# Patient Record
Sex: Female | Born: 1965
Health system: Southern US, Community
[De-identification: ages and names within clinical notes are randomized; demographics above are authoritative.]

## PROBLEM LIST (undated history)

## (undated) DIAGNOSIS — I480 Paroxysmal atrial fibrillation: Secondary | ICD-10-CM

## (undated) DIAGNOSIS — J690 Pneumonitis due to inhalation of food and vomit: Secondary | ICD-10-CM

## (undated) DIAGNOSIS — R6520 Severe sepsis without septic shock: Secondary | ICD-10-CM

## (undated) DIAGNOSIS — A419 Sepsis, unspecified organism: Secondary | ICD-10-CM

## (undated) DIAGNOSIS — J9621 Acute and chronic respiratory failure with hypoxia: Secondary | ICD-10-CM

## (undated) DIAGNOSIS — U071 COVID-19: Secondary | ICD-10-CM

## (undated) DIAGNOSIS — I469 Cardiac arrest, cause unspecified: Secondary | ICD-10-CM

---

## 2019-03-20 ENCOUNTER — Other Ambulatory Visit (HOSPITAL_COMMUNITY): Payer: Medicare Other

## 2019-03-20 ENCOUNTER — Encounter: Payer: Self-pay | Admitting: Internal Medicine

## 2019-03-20 ENCOUNTER — Inpatient Hospital Stay
Admission: RE | Admit: 2019-03-20 | Discharge: 2019-04-18 | Disposition: A | Discharge status: Home / Self Care | Payer: Medicare Other | Source: Ambulatory Visit | Attending: Internal Medicine | Admitting: Internal Medicine

## 2019-03-20 ENCOUNTER — Inpatient Hospital Stay: Admission: RE | Admit: 2019-03-20 | Payer: Self-pay | Source: Ambulatory Visit | Admitting: Internal Medicine

## 2019-03-20 DIAGNOSIS — J690 Pneumonitis due to inhalation of food and vomit: Secondary | ICD-10-CM | POA: Diagnosis not present

## 2019-03-20 DIAGNOSIS — U071 COVID-19: Secondary | ICD-10-CM | POA: Diagnosis present

## 2019-03-20 DIAGNOSIS — Z931 Gastrostomy status: Secondary | ICD-10-CM

## 2019-03-20 DIAGNOSIS — J9621 Acute and chronic respiratory failure with hypoxia: Secondary | ICD-10-CM | POA: Diagnosis not present

## 2019-03-20 DIAGNOSIS — I469 Cardiac arrest, cause unspecified: Secondary | ICD-10-CM | POA: Diagnosis present

## 2019-03-20 DIAGNOSIS — J9690 Respiratory failure, unspecified, unspecified whether with hypoxia or hypercapnia: Secondary | ICD-10-CM

## 2019-03-20 DIAGNOSIS — R6520 Severe sepsis without septic shock: Secondary | ICD-10-CM

## 2019-03-20 DIAGNOSIS — I480 Paroxysmal atrial fibrillation: Secondary | ICD-10-CM | POA: Diagnosis present

## 2019-03-20 DIAGNOSIS — A419 Sepsis, unspecified organism: Secondary | ICD-10-CM

## 2019-03-20 DIAGNOSIS — I639 Cerebral infarction, unspecified: Secondary | ICD-10-CM

## 2019-03-20 HISTORY — DX: Paroxysmal atrial fibrillation: I48.0

## 2019-03-20 HISTORY — DX: Acute and chronic respiratory failure with hypoxia: J96.21

## 2019-03-20 HISTORY — DX: COVID-19: U07.1

## 2019-03-20 HISTORY — DX: Pneumonitis due to inhalation of food and vomit: J69.0

## 2019-03-20 HISTORY — DX: Sepsis, unspecified organism: A41.9

## 2019-03-20 HISTORY — DX: Sepsis, unspecified organism: R65.20

## 2019-03-20 HISTORY — DX: Cardiac arrest, cause unspecified: I46.9

## 2019-03-20 LAB — CBC
HCT: 30.9 % — ABNORMAL LOW (ref 36.0–46.0)
Hemoglobin: 9.2 g/dL — ABNORMAL LOW (ref 12.0–15.0)
MCH: 28.6 pg (ref 26.0–34.0)
MCHC: 29.8 g/dL — ABNORMAL LOW (ref 30.0–36.0)
MCV: 96 fL (ref 80.0–100.0)
Platelets: 546 10*3/uL — ABNORMAL HIGH (ref 150–400)
RBC: 3.22 MIL/uL — ABNORMAL LOW (ref 3.87–5.11)
RDW: 14 % (ref 11.5–15.5)
WBC: 6.4 10*3/uL (ref 4.0–10.5)
nRBC: 0 % (ref 0.0–0.2)

## 2019-03-20 LAB — COMPREHENSIVE METABOLIC PANEL
ALT: 24 U/L (ref 0–44)
AST: 30 U/L (ref 15–41)
Albumin: 2.7 g/dL — ABNORMAL LOW (ref 3.5–5.0)
Alkaline Phosphatase: 115 U/L (ref 38–126)
Anion gap: 10 (ref 5–15)
BUN: 15 mg/dL (ref 6–20)
CO2: 36 mmol/L — ABNORMAL HIGH (ref 22–32)
Calcium: 10.9 mg/dL — ABNORMAL HIGH (ref 8.9–10.3)
Chloride: 97 mmol/L — ABNORMAL LOW (ref 98–111)
Creatinine, Ser: 0.49 mg/dL (ref 0.44–1.00)
GFR calc Af Amer: >60 mL/min (ref >60)
GFR calc non Af Amer: >60 mL/min (ref >60)
Glucose, Bld: 104 mg/dL — ABNORMAL HIGH (ref 70–99)
Potassium: 3.6 mmol/L (ref 3.5–5.1)
Sodium: 143 mmol/L (ref 135–145)
Total Bilirubin: 0.3 mg/dL (ref 0.3–1.2)
Total Protein: 7.8 g/dL (ref 6.5–8.1)

## 2019-03-20 LAB — URINALYSIS, ROUTINE W REFLEX MICROSCOPIC
Bilirubin Urine: NEGATIVE
Glucose, UA: NEGATIVE mg/dL
Hgb urine dipstick: NEGATIVE
Ketones, ur: NEGATIVE mg/dL
Leukocytes,Ua: NEGATIVE
Nitrite: NEGATIVE
Protein, ur: NEGATIVE mg/dL
Specific Gravity, Urine: 1.013 (ref 1.005–1.030)
pH: 6 (ref 5.0–8.0)

## 2019-03-20 LAB — BLOOD GAS, ARTERIAL
Acid-Base Excess: 10.1 mmol/L — ABNORMAL HIGH (ref 0.0–2.0)
Bicarbonate: 35.4 mmol/L — ABNORMAL HIGH (ref 20.0–28.0)
FIO2: 35
O2 Saturation: 95.6 %
Patient temperature: 37
pCO2 arterial: 60.3 mmHg — ABNORMAL HIGH (ref 32.0–48.0)
pH, Arterial: 7.387 (ref 7.350–7.450)
pO2, Arterial: 85 mmHg (ref 83.0–108.0)

## 2019-03-20 LAB — PROTIME-INR
INR: 1.2 (ref 0.8–1.2)
Prothrombin Time: 14.7 seconds (ref 11.4–15.2)

## 2019-03-20 LAB — TSH: TSH: 0.987 u[IU]/mL (ref 0.350–4.500)

## 2019-03-20 MED ORDER — IOHEXOL 300 MG/ML  SOLN
50.0000 mL | Freq: Once | INTRAMUSCULAR | Status: AC | PRN
  Administered 2019-03-20: 01:00:00 50 mL

## 2019-03-20 NOTE — Consult Note (Signed)
Pulmonary Critical Care Medicine [facility] [place]  PULMONARY SERVICE  Date of Service: 03/20/2019  PULMONARY CRITICAL CARE Shelley Hunter Shelley Hunter  ZOX:[id number]  DOB: March 21, 1965   DOA: 03/20/2019  Referring Physician: Carron Curie, MD  HPI: Shelley Hunter is a 54 y.o. female seen for follow up of Acute on Chronic Respiratory Failure.  Patient has multiple medical problems including stroke traumatic brain injury in 2013 chronic respiratory failure with chronic tracheostomy requiring nocturnal CPAP.  Patient also has had a history of a gastrostomy.  Baseline patient is nonverbal presented to the hospital because of respiratory distress apparently was found unresponsive by the family.  Noted to be in cardiac arrest CPR was started EMS called and patient was at that time noted to be hypotensive and hypoxic.  Patient was found to be in atrial fibrillation with rapid ventricular response in the ED started on fluid resuscitation and then placed on mechanical ventilation.  The patient was transferred to the ICU had ongoing issues with oxygenation.  Started on CPAP trials but patient was not really able to trigger.  The ventilator on her own.  Patient has been having issues with mucus plugging with excessive retained secretions.  Now transferred to our facility for further management  Review of Systems:  ROS performed and is unremarkable other than noted above.  Past medical history: Cardiac arrest Respiratory failure CPAP dependent Stroke Traumatic brain injury  Past surgical history tracheostomy PEG tube placement  Social history: Non-smoker nondrinker patient is pretty much bedbound  Family history: Noncontributory to the present illness   Medications: Reviewed on Rounds  Physical Exam:  Vitals: Temperature 98.1 pulse 90 respiratory rate 20 blood pressure is 108/64 saturations 100%  Ventilator Settings mode of ventilation assist control FiO2 30% tidal line 316 PEEP  5  . General: Comfortable at this time . Eyes: Grossly normal lids, irises & conjunctiva . ENT: grossly tongue is normal . Neck: no obvious mass . Cardiovascular: S1-S2 normal no gallop or rub . Respiratory: Coarse breath sounds few scattered rhonchi are noted bilaterally . Abdomen: Soft and nontender . Skin: no rash seen on limited exam . Musculoskeletal: not rigid . Psychiatric:unable to assess . Neurologic: no seizure no involuntary movements         Labs on Admission:  Basic Metabolic Panel: Recent Labs  Lab 03/20/19 0540  NA 143  K 3.6  CL 97*  CO2 36*  GLUCOSE 104*  BUN 15  CREATININE 0.49  CALCIUM 10.9*    Recent Labs  Lab 03/20/19 0206  PHART 7.387  PCO2ART 60.3*  PO2ART 85.0  HCO3 35.4*  O2SAT 95.6    Liver Function Tests: Recent Labs  Lab 03/20/19 0540  AST 30  ALT 24  ALKPHOS 115  BILITOT 0.3  PROT 7.8  ALBUMIN 2.7*   No results for input(s): LIPASE, AMYLASE in the last 168 hours. No results for input(s): AMMONIA in the last 168 hours.  CBC: Recent Labs  Lab 03/20/19 0540  WBC 6.4  HGB 9.2*  HCT 30.9*  MCV 96.0  PLT 546*    Cardiac Enzymes: No results for input(s): CKTOTAL, CKMB, CKMBINDEX, TROPONINI in the last 168 hours.  BNP (last 3 results) No results for input(s): BNP in the last 8760 hours.  ProBNP (last 3 results) No results for input(s): PROBNP in the last 8760 hours.   Radiological Exams on Admission: DG Chest Port 1 View  Result Date: 03/20/2019 CLINICAL DATA:  Respiratory failure EXAM: PORTABLE CHEST 1 VIEW  COMPARISON:  Abdominal radiograph 03/20/2019 FINDINGS: Tracheostomy tube terminates in the mid trachea. Linear metallic density projecting to the right of midline over the upper abdomen is indeterminate, correlate for a brace or device external to the patient. Telemetry leads overlie the chest. There is some mixed patchy and bandlike opacity in the right infrahilar lung which partially obscures the right  hemidiaphragm. Calcifications along the left heart border may reflect calcified hilar adenopathy versus atherosclerotic calcification of the aorta. Cardiomediastinal contours are otherwise unremarkable. Radiodense contrast material is projecting over the colon. Degenerative changes are present in the imaged spine and shoulders. IMPRESSION: 1. Right infrahilar opacity could reflect atelectasis, infection, edema or a combination there of. 2. Calcifications along the left mediastinal border may reflect calcified hilar adenopathy versus atherosclerotic calcification of the aorta. 3. Tracheostomy tube terminates in the mid trachea. 4. Linear metallic density projects to the right of the spine in the upper abdomen, correlate for finding external to the patient. Electronically Signed   By: Lovena Le M.D.   On: 03/20/2019 06:01   DG Abd Portable 1V  Result Date: 03/20/2019 CLINICAL DATA:  Peg tube placement EXAM: PORTABLE ABDOMEN - 1 VIEW COMPARISON:  None. FINDINGS: Gastrostomy tube projects over the midline and stomach. Contrast is seen within the stomach and proximal small bowel loops. No visible extravasation or [place] air. IMPRESSION: Gastrostomy tube within the stomach. No evidence of contrast extravasation. Electronically Signed   By: Rolm Baptise M.D.   On: 03/20/2019 01:17    Assessment/Plan Active Problems:   Acute on chronic respiratory failure with hypoxia (HCC)   Cardiac arrest (HCC)   COVID-19 virus infection   Severe sepsis (HCC)   Paroxysmal atrial fibrillation (HCC)   Aspiration pneumonia due to gastric secretions ([place])   1. Acute on chronic respiratory failure hypoxia patient currently is on the ventilator has not been tolerating attempts at weaning patient is going to be reassessed again and try to start her back on weaning so we can get her to T collar which is her baseline. 2. Cardiac arrest currently rhythm is stable we will continue to monitor on telemetry. 3. COVID-19 virus  infection resolved we will continue with supportive care 4. Severe sepsis right now hemodynamics are stable we will continue with present management 5. Paroxysmal atrial fibrillation rate controlled we will continue with supportive care 6. Pneumonia due to aspiration treated we will follow-up radiologically the last chest x-ray reveals atelectasis versus pneumonia.  I have personally seen and evaluated the patient, evaluated laboratory and imaging results, formulated the assessment and plan and placed orders. The Patient requires high complexity decision making with multiple systems involvement.  Case was discussed on Rounds with the Respiratory Therapy Director and the Respiratory staff Time Spent 3minutes  Conlin Brahm A Akil Hoos, MD [facility] Pulmonary Critical Care Medicine Sleep Medicine

## 2019-03-21 DIAGNOSIS — U071 COVID-19: Secondary | ICD-10-CM | POA: Diagnosis not present

## 2019-03-21 DIAGNOSIS — J9621 Acute and chronic respiratory failure with hypoxia: Secondary | ICD-10-CM | POA: Diagnosis not present

## 2019-03-21 DIAGNOSIS — I469 Cardiac arrest, cause unspecified: Secondary | ICD-10-CM | POA: Diagnosis not present

## 2019-03-21 DIAGNOSIS — J690 Pneumonitis due to inhalation of food and vomit: Secondary | ICD-10-CM | POA: Diagnosis not present

## 2019-03-21 NOTE — Progress Notes (Addendum)
Pulmonary Critical Care Medicine [place]   PULMONARY CRITICAL CARE SERVICE  PROGRESS NOTE  Date of Service: 03/21/2019  Tywana Robotham  [id number]  DOB: 04-25-1965   DOA: 03/20/2019  Referring Physician: Merton Border, MD  HPI: Shelley Hunter is a 54 y.o. female seen for follow up of Acute on Chronic Respiratory Failure.  Patient currently is on pressure support wean right now is requiring 30% FiO2.  Saturations are noted  Medications: Reviewed on Rounds  Physical Exam:  Vitals: Temperature 98.7 pulse 83 respiratory rate 18 blood pressure is 92/55 saturations 100%  Ventilator Settings mode pressure support FiO2 30% pressure 12 PEEP 5 tidal line 300  . General: Comfortable at this time . Eyes: Grossly normal lids, irises & conjunctiva . ENT: grossly tongue is normal . Neck: no obvious mass . Cardiovascular: S1 S2 normal no gallop . Respiratory: No rhonchi no rales . Abdomen: soft . Skin: no rash seen on limited exam . Musculoskeletal: not rigid . Psychiatric:unable to assess . Neurologic: no seizure no involuntary movements         Lab Data:   Basic Metabolic Panel: Recent Labs  Lab 03/20/19 0540  NA 143  K 3.6  CL 97*  CO2 36*  GLUCOSE 104*  BUN 15  CREATININE 0.49  CALCIUM 10.9*    ABG: Recent Labs  Lab 03/20/19 0206  PHART 7.387  PCO2ART 60.3*  PO2ART 85.0  HCO3 35.4*  O2SAT 95.6    Liver Function Tests: Recent Labs  Lab 03/20/19 0540  AST 30  ALT 24  ALKPHOS 115  BILITOT 0.3  PROT 7.8  ALBUMIN 2.7*   No results for input(s): LIPASE, AMYLASE in the last 168 hours. No results for input(s): AMMONIA in the last 168 hours.  CBC: Recent Labs  Lab 03/20/19 0540  WBC 6.4  HGB 9.2*  HCT 30.9*  MCV 96.0  PLT 546*    Cardiac Enzymes: No results for input(s): CKTOTAL, CKMB, CKMBINDEX, TROPONINI in the last 168 hours.  BNP (last 3 results) No results for input(s): BNP in the last 8760 hours.  ProBNP (last 3  results) No results for input(s): PROBNP in the last 8760 hours.  Radiological Exams: DG Chest Port 1 View  Result Date: 03/20/2019 CLINICAL DATA:  Respiratory failure EXAM: PORTABLE CHEST 1 VIEW COMPARISON:  Abdominal radiograph 03/20/2019 FINDINGS: Tracheostomy tube terminates in the mid trachea. Linear metallic density projecting to the right of midline over the upper abdomen is indeterminate, correlate for a brace or device external to the patient. Telemetry leads overlie the chest. There is some mixed patchy and bandlike opacity in the right infrahilar lung which partially obscures the right hemidiaphragm. Calcifications along the left heart border may reflect calcified hilar adenopathy versus atherosclerotic calcification of the aorta. Cardiomediastinal contours are otherwise unremarkable. Radiodense contrast material is projecting over the colon. Degenerative changes are present in the imaged spine and shoulders. IMPRESSION: 1. Right infrahilar opacity could reflect atelectasis, infection, edema or a combination there of. 2. Calcifications along the left mediastinal border may reflect calcified hilar adenopathy versus atherosclerotic calcification of the aorta. 3. Tracheostomy tube terminates in the mid trachea. 4. Linear metallic density projects to the right of the spine in the upper abdomen, correlate for finding external to the patient. Electronically Signed   By: Lovena Le M.D.   On: 03/20/2019 06:01   DG Abd Portable 1V  Result Date: 03/20/2019 CLINICAL DATA:  Peg tube placement EXAM: PORTABLE ABDOMEN - 1 VIEW COMPARISON:  None. FINDINGS: Gastrostomy tube projects over the midline and stomach. Contrast is seen within the stomach and proximal small bowel loops. No visible extravasation or [place] air. IMPRESSION: Gastrostomy tube within the stomach. No evidence of contrast extravasation. Electronically Signed   By: Charlett Nose M.D.   On: 03/20/2019 01:17    Assessment/Plan Active  Problems:   Acute on chronic respiratory failure with hypoxia (HCC)   Cardiac arrest (HCC)   COVID-19 virus infection   Severe sepsis (HCC)   Paroxysmal atrial fibrillation (HCC)   Aspiration pneumonia due to gastric secretions (HCC)   1. Acute on chronic respiratory failure hypoxia plan is to continue with pressure support mode titrate oxygen as tolerated continue pulmonary toilet supportive care. 2. Cardiac arrest rhythm stable 3. COVID-19 virus infection at baseline 4. Severe sepsis hemodynamics are stable resolved 5. Paroxysmal atrial fibrillation rate controlled 6. Aspiration pneumonia treated we will continue supportive care   I have personally seen and evaluated the patient, evaluated laboratory and imaging results, formulated the assessment and plan and placed orders. The Patient requires high complexity decision making with multiple systems involvement.  Rounds were done with the Respiratory Therapy Director and Staff therapists and discussed with nursing staff also.  Yevonne Pax, MD [facility] Pulmonary Critical Care Medicine Sleep Medicine

## 2019-03-22 DIAGNOSIS — J690 Pneumonitis due to inhalation of food and vomit: Secondary | ICD-10-CM | POA: Diagnosis not present

## 2019-03-22 DIAGNOSIS — U071 COVID-19: Secondary | ICD-10-CM | POA: Diagnosis not present

## 2019-03-22 DIAGNOSIS — J9621 Acute and chronic respiratory failure with hypoxia: Secondary | ICD-10-CM | POA: Diagnosis not present

## 2019-03-22 DIAGNOSIS — I469 Cardiac arrest, cause unspecified: Secondary | ICD-10-CM | POA: Diagnosis not present

## 2019-03-22 MED ORDER — FUROSEMIDE 40 MG PO TABS
40.00 | ORAL_TABLET | ORAL | Status: DC
Start: 2019-03-20 — End: 2019-03-22

## 2019-03-22 MED ORDER — ASPIRIN 81 MG PO TBEC
81.00 | DELAYED_RELEASE_TABLET | ORAL | Status: DC
Start: 2019-03-20 — End: 2019-03-22

## 2019-03-22 MED ORDER — CHOLECALCIFEROL 25 MCG (1000 UT) PO TABS
1000.00 | ORAL_TABLET | ORAL | Status: DC
Start: 2019-03-20 — End: 2019-03-22

## 2019-03-22 MED ORDER — QUETIAPINE FUMARATE 25 MG PO TABS
12.50 | ORAL_TABLET | ORAL | Status: DC
Start: ? — End: 2019-03-22

## 2019-03-22 MED ORDER — INSULIN LISPRO 100 UNIT/ML ~~LOC~~ SOLN
0.00 | SUBCUTANEOUS | Status: DC
Start: 2019-03-20 — End: 2019-03-22

## 2019-03-22 MED ORDER — ALBUTEROL SULFATE (5 MG/ML) 0.5% IN NEBU
2.50 | INHALATION_SOLUTION | RESPIRATORY_TRACT | Status: DC
Start: 2019-03-20 — End: 2019-03-22

## 2019-03-22 MED ORDER — SERTRALINE HCL 50 MG PO TABS
50.00 | ORAL_TABLET | ORAL | Status: DC
Start: 2019-03-20 — End: 2019-03-22

## 2019-03-22 MED ORDER — SENNOSIDES-DOCUSATE SODIUM 8.6-50 MG PO TABS
2.00 | ORAL_TABLET | ORAL | Status: DC
Start: 2019-03-20 — End: 2019-03-22

## 2019-03-22 MED ORDER — GLUCOSE-VITAMIN C 4-6 GM-MG PO CHEW
16.00 | CHEWABLE_TABLET | ORAL | Status: DC
Start: ? — End: 2019-03-22

## 2019-03-22 MED ORDER — APIXABAN 5 MG PO TABS
5.00 | ORAL_TABLET | ORAL | Status: DC
Start: 2019-03-20 — End: 2019-03-22

## 2019-03-22 MED ORDER — GENERIC EXTERNAL MEDICATION
Status: DC
Start: ? — End: 2019-03-22

## 2019-03-22 MED ORDER — DEXTROSE 50 % IV SOLN
25.00 | INTRAVENOUS | Status: DC
Start: ? — End: 2019-03-22

## 2019-03-22 MED ORDER — MIDODRINE HCL 5 MG PO TABS
5.00 | ORAL_TABLET | ORAL | Status: DC
Start: 2019-03-20 — End: 2019-03-22

## 2019-03-22 MED ORDER — L-LYSINE EX OINT
TOPICAL_OINTMENT | CUTANEOUS | Status: DC
Start: ? — End: 2019-03-22

## 2019-03-22 MED ORDER — MELATONIN 1 MG PO TABS
3.00 | ORAL_TABLET | ORAL | Status: DC
Start: 2019-03-20 — End: 2019-03-22

## 2019-03-22 MED ORDER — GLUCAGON (RDNA) 1 MG IJ KIT
1.00 | PACK | INTRAMUSCULAR | Status: DC
Start: ? — End: 2019-03-22

## 2019-03-22 MED ORDER — CHLORHEXIDINE GLUCONATE 0.12 % MT SOLN
15.00 | OROMUCOSAL | Status: DC
Start: 2019-03-20 — End: 2019-03-22

## 2019-03-22 NOTE — Progress Notes (Addendum)
Pulmonary Critical Care Medicine [facility] [place]   PULMONARY CRITICAL CARE SERVICE  PROGRESS NOTE  Date of Service: 03/22/2019  Shelley Hunter  WGN:[id number]  DOB: 1965/08/17   DOA: 03/20/2019  Referring Physician: Carron Curie, MD  Shelley Hunter is a 54 y.o. female seen for follow up of Acute on Chronic Respiratory Failure.  Patient completed 4 hours on pressure support is now back resting on full support ventilator.  No weakness or distress noted.  Medications: Reviewed on Rounds  Physical Exam:  Vitals: Pulse 72 respirations 18 BP 101/62 O2 sat 99% temp 97.6  Ventilator Settings ventilator mode AC VC rate of 15 tidal line 300 PEEP of 5 FiO2 30%  . General: Comfortable at this time . Eyes: Grossly normal lids, irises & conjunctiva . ENT: grossly tongue is normal . Neck: no obvious mass . Cardiovascular: S1 S2 normal no gallop . Respiratory: No rales or rhonchi noted . Abdomen: soft . Skin: no rash seen on limited exam . Musculoskeletal: not rigid . Psychiatric:unable to assess . Neurologic: no seizure no involuntary movements         Lab Data:   Basic Metabolic Panel: Recent Labs  Lab 03/20/19 0540  NA 143  K 3.6  CL 97*  CO2 36*  GLUCOSE 104*  BUN 15  CREATININE 0.49  CALCIUM 10.9*    ABG: Recent Labs  Lab 03/20/19 0206  PHART 7.387  PCO2ART 60.3*  PO2ART 85.0  HCO3 35.4*  O2SAT 95.6    Liver Function Tests: Recent Labs  Lab 03/20/19 0540  AST 30  ALT 24  ALKPHOS 115  BILITOT 0.3  PROT 7.8  ALBUMIN 2.7*   No results for input(s): LIPASE, AMYLASE in the last 168 hours. No results for input(s): AMMONIA in the last 168 hours.  CBC: Recent Labs  Lab 03/20/19 0540  WBC 6.4  HGB 9.2*  HCT 30.9*  MCV 96.0  PLT 546*    Cardiac Enzymes: No results for input(s): CKTOTAL, CKMB, CKMBINDEX, TROPONINI in the last 168 hours.  BNP (last 3 results) No results for input(s): BNP in the last 8760 hours.  ProBNP (last 3  results) No results for input(s): PROBNP in the last 8760 hours.  Radiological Exams: No results found.  Assessment/Plan Active Problems:   Acute on chronic respiratory failure with hypoxia (HCC)   Cardiac arrest (HCC)   COVID-19 virus infection   Severe sepsis (HCC)   Paroxysmal atrial fibrillation (HCC)   Aspiration pneumonia due to gastric secretions (HCC)   1. Acute on chronic respiratory failure hypoxia we will continue to attempt weaning per protocol.  Completed 4 hours today resting on the ventilator.  Continue supportive measures and pulmonary toilet 2. Cardiac arrest rhythm stable 3. COVID-19 virus infection at baseline 4. Severe sepsis hemodynamics are stable resolved 5. Paroxysmal atrial fibrillation rate controlled 6. Aspiration pneumonia treated we will continue supportive care   I have personally seen and evaluated the patient, evaluated laboratory and imaging results, formulated the assessment and plan and placed orders. The Patient requires high complexity decision making with multiple systems involvement.  Rounds were done with the Respiratory Therapy Director and Staff therapists and discussed with nursing staff also.  Yevonne Pax, MD [facility] Pulmonary Critical Care Medicine Sleep Medicine

## 2019-03-23 DIAGNOSIS — I469 Cardiac arrest, cause unspecified: Secondary | ICD-10-CM | POA: Diagnosis not present

## 2019-03-23 DIAGNOSIS — J690 Pneumonitis due to inhalation of food and vomit: Secondary | ICD-10-CM | POA: Diagnosis not present

## 2019-03-23 DIAGNOSIS — U071 COVID-19: Secondary | ICD-10-CM | POA: Diagnosis not present

## 2019-03-23 DIAGNOSIS — J9621 Acute and chronic respiratory failure with hypoxia: Secondary | ICD-10-CM | POA: Diagnosis not present

## 2019-03-23 NOTE — Progress Notes (Addendum)
Pulmonary Critical Care Medicine [facility] [place]   PULMONARY CRITICAL CARE SERVICE  PROGRESS NOTE  Date of Service: 03/23/2019  Anwyn Kriegel  [id number]  DOB: 08/15/1965   DOA: 03/20/2019  Referring Physician: Carron Curie, MD  HPI: Genessa Beman is a 54 y.o. female seen for follow up of Acute on Chronic Respiratory Failure.  Patient has an 8-hour goal today on pressure support 12/5 FiO2 30%.  Satting well at this time with no fever or distress.  Medications: Reviewed on Rounds  Physical Exam:  Vitals: Pulse 75 respirations 15 BP 103/61 O2 sat 99% temp 97.3  Ventilator Settings per support 12/5 FiO2 30%  . General: Comfortable at this time . Eyes: Grossly normal lids, irises & conjunctiva . ENT: grossly tongue is normal . Neck: no obvious mass . Cardiovascular: S1 S2 normal no gallop . Respiratory: No rales or rhonchi noted . Abdomen: soft . Skin: no rash seen on limited exam . Musculoskeletal: not rigid . Psychiatric:unable to assess . Neurologic: no seizure no involuntary movements         Lab Data:   Basic Metabolic Panel: Recent Labs  Lab 03/20/19 0540  NA 143  K 3.6  CL 97*  CO2 36*  GLUCOSE 104*  BUN 15  CREATININE 0.49  CALCIUM 10.9*    ABG: Recent Labs  Lab 03/20/19 0206  PHART 7.387  PCO2ART 60.3*  PO2ART 85.0  HCO3 35.4*  O2SAT 95.6    Liver Function Tests: Recent Labs  Lab 03/20/19 0540  AST 30  ALT 24  ALKPHOS 115  BILITOT 0.3  PROT 7.8  ALBUMIN 2.7*   No results for input(s): LIPASE, AMYLASE in the last 168 hours. No results for input(s): AMMONIA in the last 168 hours.  CBC: Recent Labs  Lab 03/20/19 0540  WBC 6.4  HGB 9.2*  HCT 30.9*  MCV 96.0  PLT 546*    Cardiac Enzymes: No results for input(s): CKTOTAL, CKMB, CKMBINDEX, TROPONINI in the last 168 hours.  BNP (last 3 results) No results for input(s): BNP in the last 8760 hours.  ProBNP (last 3 results) No results for input(s): PROBNP in the  last 8760 hours.  Radiological Exams: No results found.  Assessment/Plan Active Problems:   Acute on chronic respiratory failure with hypoxia (HCC)   Cardiac arrest (HCC)   COVID-19 virus infection   Severe sepsis (HCC)   Paroxysmal atrial fibrillation (HCC)   Aspiration pneumonia due to gastric secretions (HCC)   1. Acute on chronic respiratory failure hypoxia we will continue to attempt weaning per protocol.  Completed 8 hours today resting on the ventilator.  Continue supportive measures and pulmonary toilet 2. Cardiac arrest rhythm stable 3. COVID-19 virus infection at baseline 4. Severe sepsis hemodynamics are stable resolved 5. Paroxysmal atrial fibrillation rate controlled 6. Aspiration pneumonia treated we will continue supportive care   I have personally seen and evaluated the patient, evaluated laboratory and imaging results, formulated the assessment and plan and placed orders. The Patient requires high complexity decision making with multiple systems involvement.  Rounds were done with the Respiratory Therapy Director and Staff therapists and discussed with nursing staff also.  Yevonne Pax, MD [facility] Pulmonary Critical Care Medicine Sleep Medicine

## 2019-03-24 DIAGNOSIS — I469 Cardiac arrest, cause unspecified: Secondary | ICD-10-CM | POA: Diagnosis not present

## 2019-03-24 DIAGNOSIS — J690 Pneumonitis due to inhalation of food and vomit: Secondary | ICD-10-CM | POA: Diagnosis not present

## 2019-03-24 DIAGNOSIS — J9621 Acute and chronic respiratory failure with hypoxia: Secondary | ICD-10-CM | POA: Diagnosis not present

## 2019-03-24 DIAGNOSIS — U071 COVID-19: Secondary | ICD-10-CM | POA: Diagnosis not present

## 2019-03-24 MED ORDER — GENERIC EXTERNAL MEDICATION
Status: DC
Start: ? — End: 2019-03-24

## 2019-03-24 NOTE — Progress Notes (Signed)
Pulmonary Critical Care Medicine [facility] [place]   PULMONARY CRITICAL CARE SERVICE  PROGRESS NOTE  Date of Service: 03/24/2019  Shelley Hunter  [id number]  DOB: 08-31-1965   DOA: 03/20/2019  Referring Physician: Carron Curie, MD  HPI: Shelley Hunter is a 54 y.o. female seen for follow up of Acute on Chronic Respiratory Failure.  Patient is on pressure support now on 28% FiO2 12/5 with a goal of 8 hours  Medications: Reviewed on Rounds  Physical Exam:  Vitals: Temperature 98.3 pulse 85 respiratory rate 17 blood pressure is 113/65 saturations 100%  Ventilator Settings mode ventilation pressure support FiO2 20% pressure 12 PEEP 5  . General: Comfortable at this time . Eyes: Grossly normal lids, irises & conjunctiva . ENT: grossly tongue is normal . Neck: no obvious mass . Cardiovascular: S1 S2 normal no gallop . Respiratory: No rhonchi no rales are noted at this time . Abdomen: soft . Skin: no rash seen on limited exam . Musculoskeletal: not rigid . Psychiatric:unable to assess . Neurologic: no seizure no involuntary movements         Lab Data:   Basic Metabolic Panel: Recent Labs  Lab 03/20/19 0540  NA 143  K 3.6  CL 97*  CO2 36*  GLUCOSE 104*  BUN 15  CREATININE 0.49  CALCIUM 10.9*    ABG: Recent Labs  Lab 03/20/19 0206  PHART 7.387  PCO2ART 60.3*  PO2ART 85.0  HCO3 35.4*  O2SAT 95.6    Liver Function Tests: Recent Labs  Lab 03/20/19 0540  AST 30  ALT 24  ALKPHOS 115  BILITOT 0.3  PROT 7.8  ALBUMIN 2.7*   No results for input(s): LIPASE, AMYLASE in the last 168 hours. No results for input(s): AMMONIA in the last 168 hours.  CBC: Recent Labs  Lab 03/20/19 0540  WBC 6.4  HGB 9.2*  HCT 30.9*  MCV 96.0  PLT 546*    Cardiac Enzymes: No results for input(s): CKTOTAL, CKMB, CKMBINDEX, TROPONINI in the last 168 hours.  BNP (last 3 results) No results for input(s): BNP in the last 8760 hours.  ProBNP (last 3 results) No  results for input(s): PROBNP in the last 8760 hours.  Radiological Exams: No results found.  Assessment/Plan Active Problems:   Acute on chronic respiratory failure with hypoxia (HCC)   Cardiac arrest (HCC)   COVID-19 virus infection   Severe sepsis (HCC)   Paroxysmal atrial fibrillation (HCC)   Aspiration pneumonia due to gastric secretions (HCC)   1. Acute on chronic respiratory failure hypoxia plan is to continue with pressure support 5 goal 8 hours continue to advance as tolerated 2. Cardiac arrest rhythm stable we will continue to monitor 3. COVID-19 virus infection at baseline 4. Severe sepsis resolved 5. Paroxysmal atrial fibrillation rate controlled 6. Aspiration pneumonia treated we will continue with present management   I have personally seen and evaluated the patient, evaluated laboratory and imaging results, formulated the assessment and plan and placed orders. The Patient requires high complexity decision making with multiple systems involvement.  Rounds were done with the Respiratory Therapy Director and Staff therapists and discussed with nursing staff also.  Yevonne Pax, MD [facility] Pulmonary Critical Care Medicine Sleep Medicine

## 2019-03-25 DIAGNOSIS — J9621 Acute and chronic respiratory failure with hypoxia: Secondary | ICD-10-CM | POA: Diagnosis not present

## 2019-03-25 DIAGNOSIS — U071 COVID-19: Secondary | ICD-10-CM | POA: Diagnosis not present

## 2019-03-25 DIAGNOSIS — J690 Pneumonitis due to inhalation of food and vomit: Secondary | ICD-10-CM | POA: Diagnosis not present

## 2019-03-25 DIAGNOSIS — I469 Cardiac arrest, cause unspecified: Secondary | ICD-10-CM | POA: Diagnosis not present

## 2019-03-25 NOTE — Progress Notes (Signed)
Pulmonary Critical Care Medicine [facility] [place]   PULMONARY CRITICAL CARE SERVICE  PROGRESS NOTE  Date of Service: 03/25/2019  Shelley Hunter  [id number]  DOB: 1965/10/12   DOA: 03/20/2019  Referring Physician: Carron Curie, MD  HPI: Shelley Hunter is a 54 y.o. female seen for follow up of Acute on Chronic Respiratory Failure.  Patient right now is on pressure support with a goal of about 12 hours  Medications: Reviewed on Rounds  Physical Exam:  Vitals: Temperature 98.5 pulse 99 respiratory 15 blood pressure is 118/69 saturations 100%  Ventilator Settings mode ventilation pressure support FiO2 20% pressure 12 PEEP 5  . General: Comfortable at this time . Eyes: Grossly normal lids, irises & conjunctiva . ENT: grossly tongue is normal . Neck: no obvious mass . Cardiovascular: S1 S2 normal no gallop . Respiratory: No rhonchi coarse breath sounds are noted . Abdomen: soft . Skin: no rash seen on limited exam . Musculoskeletal: not rigid . Psychiatric:unable to assess . Neurologic: no seizure no involuntary movements         Lab Data:   Basic Metabolic Panel: Recent Labs  Lab 03/20/19 0540  NA 143  K 3.6  CL 97*  CO2 36*  GLUCOSE 104*  BUN 15  CREATININE 0.49  CALCIUM 10.9*    ABG: Recent Labs  Lab 03/20/19 0206  PHART 7.387  PCO2ART 60.3*  PO2ART 85.0  HCO3 35.4*  O2SAT 95.6    Liver Function Tests: Recent Labs  Lab 03/20/19 0540  AST 30  ALT 24  ALKPHOS 115  BILITOT 0.3  PROT 7.8  ALBUMIN 2.7*   No results for input(s): LIPASE, AMYLASE in the last 168 hours. No results for input(s): AMMONIA in the last 168 hours.  CBC: Recent Labs  Lab 03/20/19 0540  WBC 6.4  HGB 9.2*  HCT 30.9*  MCV 96.0  PLT 546*    Cardiac Enzymes: No results for input(s): CKTOTAL, CKMB, CKMBINDEX, TROPONINI in the last 168 hours.  BNP (last 3 results) No results for input(s): BNP in the last 8760 hours.  ProBNP (last 3 results) No results for  input(s): PROBNP in the last 8760 hours.  Radiological Exams: No results found.  Assessment/Plan Active Problems:   Acute on chronic respiratory failure with hypoxia (HCC)   Cardiac arrest (HCC)   COVID-19 virus infection   Severe sepsis (HCC)   Paroxysmal atrial fibrillation (HCC)   Aspiration pneumonia due to gastric secretions (HCC)   1. Acute on chronic respiratory failure with hypoxia plan is to continue with pressure support on 28% FiO2 with a goal of 12 hours 2. Cardiac arrest rhythm has been stable we will continue to monitor 3. COVID-19 virus infection treated we will continue with supportive care 4. Severe sepsis resolved 5. Paroxysmal atrial fibrillation rate controlled 6. Aspiration pneumonia treated clinically is improving   I have personally seen and evaluated the patient, evaluated laboratory and imaging results, formulated the assessment and plan and placed orders. The Patient requires high complexity decision making with multiple systems involvement.  Rounds were done with the Respiratory Therapy Director and Staff therapists and discussed with nursing staff also.  Yevonne Pax, MD [facility] Pulmonary Critical Care Medicine Sleep Medicine

## 2019-03-26 DIAGNOSIS — U071 COVID-19: Secondary | ICD-10-CM | POA: Diagnosis not present

## 2019-03-26 DIAGNOSIS — J690 Pneumonitis due to inhalation of food and vomit: Secondary | ICD-10-CM | POA: Diagnosis not present

## 2019-03-26 DIAGNOSIS — I469 Cardiac arrest, cause unspecified: Secondary | ICD-10-CM | POA: Diagnosis not present

## 2019-03-26 DIAGNOSIS — J9621 Acute and chronic respiratory failure with hypoxia: Secondary | ICD-10-CM | POA: Diagnosis not present

## 2019-03-26 LAB — BASIC METABOLIC PANEL
Anion gap: 12 (ref 5–15)
BUN: 20 mg/dL (ref 6–20)
CO2: 37 mmol/L — ABNORMAL HIGH (ref 22–32)
Calcium: 10.9 mg/dL — ABNORMAL HIGH (ref 8.9–10.3)
Chloride: 97 mmol/L — ABNORMAL LOW (ref 98–111)
Creatinine, Ser: 0.41 mg/dL — ABNORMAL LOW (ref 0.44–1.00)
GFR calc Af Amer: >60 mL/min (ref >60)
GFR calc non Af Amer: >60 mL/min (ref >60)
Glucose, Bld: 96 mg/dL (ref 70–99)
Potassium: 4.7 mmol/L (ref 3.5–5.1)
Sodium: 146 mmol/L — ABNORMAL HIGH (ref 135–145)

## 2019-03-26 LAB — CBC
HCT: 28.8 % — ABNORMAL LOW (ref 36.0–46.0)
Hemoglobin: 8.3 g/dL — ABNORMAL LOW (ref 12.0–15.0)
MCH: 28.2 pg (ref 26.0–34.0)
MCHC: 28.8 g/dL — ABNORMAL LOW (ref 30.0–36.0)
MCV: 98 fL (ref 80.0–100.0)
Platelets: 413 10*3/uL — ABNORMAL HIGH (ref 150–400)
RBC: 2.94 MIL/uL — ABNORMAL LOW (ref 3.87–5.11)
RDW: 14.6 % (ref 11.5–15.5)
WBC: 5.8 10*3/uL (ref 4.0–10.5)
nRBC: 0 % (ref 0.0–0.2)

## 2019-03-26 MED ORDER — GENERIC EXTERNAL MEDICATION
Status: DC
Start: ? — End: 2019-03-26

## 2019-03-26 NOTE — Progress Notes (Signed)
Pulmonary Critical Care Medicine [facility] [place]   PULMONARY CRITICAL CARE SERVICE  PROGRESS NOTE  Date of Service: 03/26/2019  Shelley Hunter  [id number]:[id number]  DOB: Apr 17, 1965   DOA: 03/20/2019  Referring Physician: Carron Curie, MD  HPI: Shelley Hunter is a 54 y.o. female seen for follow up of Acute on Chronic Respiratory Failure.  Patient is on pressure support 12/5 good tidal volumes weaning right now  Medications: Reviewed on Rounds  Physical Exam:  Vitals: Temperature 97.8 pulse 71 respiratory 15 blood pressure is 102/57 saturations 95%  Ventilator Settings on pressure support FiO2 28% pressure 12 PEEP 5  . General: Comfortable at this time . Eyes: Grossly normal lids, irises & conjunctiva . ENT: grossly tongue is normal . Neck: no obvious mass . Cardiovascular: S1 S2 normal no gallop . Respiratory: Coarse breath sounds few scattered rhonchi are noted . Abdomen: soft . Skin: no rash seen on limited exam . Musculoskeletal: not rigid . Psychiatric:unable to assess . Neurologic: no seizure no involuntary movements         Lab Data:   Basic Metabolic Panel: Recent Labs  Lab 03/20/19 0540 03/26/19 0643  NA 143 146*  K 3.6 4.7  CL 97* 97*  CO2 36* 37*  GLUCOSE 104* 96  BUN 15 20  CREATININE 0.49 0.41*  CALCIUM 10.9* 10.9*    ABG: Recent Labs  Lab 03/20/19 0206  PHART 7.387  PCO2ART 60.3*  PO2ART 85.0  HCO3 35.4*  O2SAT 95.6    Liver Function Tests: Recent Labs  Lab 03/20/19 0540  AST 30  ALT 24  ALKPHOS 115  BILITOT 0.3  PROT 7.8  ALBUMIN 2.7*   No results for input(s): LIPASE, AMYLASE in the last 168 hours. No results for input(s): AMMONIA in the last 168 hours.  CBC: Recent Labs  Lab 03/20/19 0540 03/26/19 0643  WBC 6.4 5.8  HGB 9.2* 8.3*  HCT 30.9* 28.8*  MCV 96.0 98.0  PLT 546* 413*    Cardiac Enzymes: No results for input(s): CKTOTAL, CKMB, CKMBINDEX, TROPONINI in the last 168 hours.  BNP (last 3 results) No  results for input(s): BNP in the last 8760 hours.  ProBNP (last 3 results) No results for input(s): PROBNP in the last 8760 hours.  Radiological Exams: No results found.  Assessment/Plan Active Problems:   Acute on chronic respiratory failure with hypoxia (HCC)   Cardiac arrest (HCC)   COVID-19 virus infection   Severe sepsis (HCC)   Paroxysmal atrial fibrillation (HCC)   Aspiration pneumonia due to gastric secretions (HCC)   1. Acute on chronic respiratory failure with hypoxia patient is currently on pressure support 12/5 plan is motivated continue with therapy 2. Cardiac arrest rhythm is stable we will continue to monitor 3. COVID-19 virus infection.  Resolving 4. Severe sepsis resolved 5. Paroxysmal atrial fibrillation rate controlled   I have personally seen and evaluated the patient, evaluated laboratory and imaging results, formulated the assessment and plan and placed orders. The Patient requires high complexity decision making with multiple systems involvement.  Rounds were done with the Respiratory Therapy Director and Staff therapists and discussed with nursing staff also.  Yevonne Pax, MD [facility] Pulmonary Critical Care Medicine Sleep Medicine

## 2019-03-27 DIAGNOSIS — J9621 Acute and chronic respiratory failure with hypoxia: Secondary | ICD-10-CM | POA: Diagnosis not present

## 2019-03-27 DIAGNOSIS — J690 Pneumonitis due to inhalation of food and vomit: Secondary | ICD-10-CM | POA: Diagnosis not present

## 2019-03-27 DIAGNOSIS — I469 Cardiac arrest, cause unspecified: Secondary | ICD-10-CM | POA: Diagnosis not present

## 2019-03-27 DIAGNOSIS — U071 COVID-19: Secondary | ICD-10-CM | POA: Diagnosis not present

## 2019-03-27 NOTE — Progress Notes (Signed)
Pulmonary Critical Care Medicine [facility] [place]   PULMONARY CRITICAL CARE SERVICE  PROGRESS NOTE  Date of Service: 03/27/2019  Tannya Gonet  EYC:[id number]  DOB: [birth year]   DOA: 03/20/2019  Referring Physician: Carron Curie, MD  HPI: Shelley Hunter is a 54 y.o. female seen for follow up of Acute on Chronic Respiratory Failure.  Patient is off the ventilator on the NAG currently is on 28% FiO2  Medications: Reviewed on Rounds  Physical Exam:  Vitals: Temperature 97.5 pulse 88 respiratory rate 20 blood pressure is 113/65  Ventilator Settings off the ventilator on the NAG FiO2 28%  . General: Comfortable at this time . Eyes: Grossly normal lids, irises & conjunctiva . ENT: grossly tongue is normal . Neck: no obvious mass . Cardiovascular: S1 S2 normal no gallop . Respiratory: No rhonchi no rales are noted at this time . Abdomen: soft . Skin: no rash seen on limited exam . Musculoskeletal: not rigid . Psychiatric:unable to assess . Neurologic: no seizure no involuntary movements         Lab Data:   Basic Metabolic Panel: Recent Labs  Lab 03/26/19 0643  NA 146*  K 4.7  CL 97*  CO2 37*  GLUCOSE 96  BUN 20  CREATININE 0.41*  CALCIUM 10.9*    ABG: No results for input(s): PHART, PCO2ART, PO2ART, HCO3, O2SAT in the last 168 hours.  Liver Function Tests: No results for input(s): AST, ALT, ALKPHOS, BILITOT, PROT, ALBUMIN in the last 168 hours. No results for input(s): LIPASE, AMYLASE in the last 168 hours. No results for input(s): AMMONIA in the last 168 hours.  CBC: Recent Labs  Lab 03/26/19 0643  WBC 5.8  HGB 8.3*  HCT 28.8*  MCV 98.0  PLT 413*    Cardiac Enzymes: No results for input(s): CKTOTAL, CKMB, CKMBINDEX, TROPONINI in the last 168 hours.  BNP (last 3 results) No results for input(s): BNP in the last 8760 hours.  ProBNP (last 3 results) No results for input(s): PROBNP in the last 8760 hours.  Radiological Exams: No results  found.  Assessment/Plan Active Problems:   Acute on chronic respiratory failure with hypoxia (HCC)   Cardiac arrest (HCC)   COVID-19 virus infection   Severe sepsis (HCC)   Paroxysmal atrial fibrillation (HCC)   Aspiration pneumonia due to gastric secretions (HCC)   1. Acute on chronic respiratory failure hypoxia plan continue with the NAG titrate oxygen as tolerated 2. COVID-19 virus infection treated improving 3. Severe sepsis resolved 4. Paroxysmal atrial fibrillation rate controlled 5. Cardiac arrest rhythm stable 6. Aspiration pneumonia treated we will continue to monitor   I have personally seen and evaluated the patient, evaluated laboratory and imaging results, formulated the assessment and plan and placed orders. The Patient requires high complexity decision making with multiple systems involvement.  Rounds were done with the Respiratory Therapy Director and Staff therapists and discussed with nursing staff also.  Yevonne Pax, MD [facility] Pulmonary Critical Care Medicine Sleep Medicine

## 2019-03-28 DIAGNOSIS — J690 Pneumonitis due to inhalation of food and vomit: Secondary | ICD-10-CM | POA: Diagnosis not present

## 2019-03-28 DIAGNOSIS — J9621 Acute and chronic respiratory failure with hypoxia: Secondary | ICD-10-CM | POA: Diagnosis not present

## 2019-03-28 DIAGNOSIS — I469 Cardiac arrest, cause unspecified: Secondary | ICD-10-CM | POA: Diagnosis not present

## 2019-03-28 DIAGNOSIS — U071 COVID-19: Secondary | ICD-10-CM | POA: Diagnosis not present

## 2019-03-28 MED ORDER — GENERIC EXTERNAL MEDICATION
Status: DC
Start: ? — End: 2019-03-28

## 2019-03-28 NOTE — Progress Notes (Signed)
Pulmonary Critical Care Medicine [facility] [place]   PULMONARY CRITICAL CARE SERVICE  PROGRESS NOTE  Date of Service: 03/28/2019  Shelley Hunter  [id number]  DOB: 1965/11/25   DOA: 03/20/2019  Referring Physician: Carron Curie, MD  HPI: Shelley Hunter is a 54 y.o. female seen for follow up of Acute on Chronic Respiratory Failure.  Patient right now is on full support on assist control mode respiratory therapy will assess weaning readiness today  Medications: Reviewed on Rounds  Physical Exam:  Vitals: Temperature 98.8 pulse 74 respiratory 18 blood pressure is 108/62 saturations 100%  Ventilator Settings on assist control FiO2 28% tidal volume 300 PEEP 5  . General: Comfortable at this time . Eyes: Grossly normal lids, irises & conjunctiva . ENT: grossly tongue is normal . Neck: no obvious mass . Cardiovascular: S1 S2 normal no gallop . Respiratory: Coarse breath sounds with few scattered rhonchi . Abdomen: soft . Skin: no rash seen on limited exam . Musculoskeletal: not rigid . Psychiatric:unable to assess . Neurologic: no seizure no involuntary movements         Lab Data:   Basic Metabolic Panel: Recent Labs  Lab 03/26/19 0643  NA 146*  K 4.7  CL 97*  CO2 37*  GLUCOSE 96  BUN 20  CREATININE 0.41*  CALCIUM 10.9*    ABG: No results for input(s): PHART, PCO2ART, PO2ART, HCO3, O2SAT in the last 168 hours.  Liver Function Tests: No results for input(s): AST, ALT, ALKPHOS, BILITOT, PROT, ALBUMIN in the last 168 hours. No results for input(s): LIPASE, AMYLASE in the last 168 hours. No results for input(s): AMMONIA in the last 168 hours.  CBC: Recent Labs  Lab 03/26/19 0643  WBC 5.8  HGB 8.3*  HCT 28.8*  MCV 98.0  PLT 413*    Cardiac Enzymes: No results for input(s): CKTOTAL, CKMB, CKMBINDEX, TROPONINI in the last 168 hours.  BNP (last 3 results) No results for input(s): BNP in the last 8760 hours.  ProBNP (last 3 results) No results for  input(s): PROBNP in the last 8760 hours.  Radiological Exams: No results found.  Assessment/Plan Active Problems:   Acute on chronic respiratory failure with hypoxia (HCC)   Cardiac arrest (HCC)   COVID-19 virus infection   Severe sepsis (HCC)   Paroxysmal atrial fibrillation (HCC)   Aspiration pneumonia due to gastric secretions (HCC)   1. Acute on chronic respiratory failure hypoxia plan is to continue with making attempts at weaning titrate oxygen continue pulmonary toilet 2. Cardiac arrest rhythm stable 3. COVID-19 virus infection no change 4. Severe sepsis hemodynamics are stable 5. Paroxysmal atrial fibrillation rate controlled 6. Aspiration pneumonia treated we will continue to follow   I have personally seen and evaluated the patient, evaluated laboratory and imaging results, formulated the assessment and plan and placed orders. The Patient requires high complexity decision making with multiple systems involvement.  Rounds were done with the Respiratory Therapy Director and Staff therapists and discussed with nursing staff also.  Yevonne Pax, MD [facility] Pulmonary Critical Care Medicine Sleep Medicine

## 2019-03-29 DIAGNOSIS — J9621 Acute and chronic respiratory failure with hypoxia: Secondary | ICD-10-CM | POA: Diagnosis not present

## 2019-03-29 DIAGNOSIS — U071 COVID-19: Secondary | ICD-10-CM | POA: Diagnosis not present

## 2019-03-29 DIAGNOSIS — J690 Pneumonitis due to inhalation of food and vomit: Secondary | ICD-10-CM | POA: Diagnosis not present

## 2019-03-29 DIAGNOSIS — I469 Cardiac arrest, cause unspecified: Secondary | ICD-10-CM | POA: Diagnosis not present

## 2019-03-29 NOTE — Progress Notes (Addendum)
Pulmonary Critical Care Medicine [facility] [place]   PULMONARY CRITICAL CARE SERVICE  PROGRESS NOTE  Date of Service: 03/29/2019  Shelley Hunter  [id number]  DOB: [birth year]   DOA: 03/20/2019  Referring Physician: Carron Curie, MD  HPI: Shelley Hunter is a 54 y.o. female seen for follow up of Acute on Chronic Respiratory Failure.  Patient remains on pressure support at this time for 16-hour goal today currently satting well no distress.  Medications: Reviewed on Rounds  Physical Exam:  Vitals: Pulse 79 respirations 19 BP 95/55 O2 sat 90% temp 97.6  Ventilator Settings pressure support 12/5 FiO2 28%  . General: Comfortable at this time . Eyes: Grossly normal lids, irises & conjunctiva . ENT: grossly tongue is normal . Neck: no obvious mass . Cardiovascular: S1 S2 normal no gallop . Respiratory: No rales or rhonchi noted . Abdomen: soft . Skin: no rash seen on limited exam . Musculoskeletal: not rigid . Psychiatric:unable to assess . Neurologic: no seizure no involuntary movements         Lab Data:   Basic Metabolic Panel: Recent Labs  Lab 03/26/19 0643  NA 146*  K 4.7  CL 97*  CO2 37*  GLUCOSE 96  BUN 20  CREATININE 0.41*  CALCIUM 10.9*    ABG: No results for input(s): PHART, PCO2ART, PO2ART, HCO3, O2SAT in the last 168 hours.  Liver Function Tests: No results for input(s): AST, ALT, ALKPHOS, BILITOT, PROT, ALBUMIN in the last 168 hours. No results for input(s): LIPASE, AMYLASE in the last 168 hours. No results for input(s): AMMONIA in the last 168 hours.  CBC: Recent Labs  Lab 03/26/19 0643  WBC 5.8  HGB 8.3*  HCT 28.8*  MCV 98.0  PLT 413*    Cardiac Enzymes: No results for input(s): CKTOTAL, CKMB, CKMBINDEX, TROPONINI in the last 168 hours.  BNP (last 3 results) No results for input(s): BNP in the last 8760 hours.  ProBNP (last 3 results) No results for input(s): PROBNP in the last 8760 hours.  Radiological Exams: No results  found.  Assessment/Plan Active Problems:   Acute on chronic respiratory failure with hypoxia (HCC)   Cardiac arrest (HCC)   COVID-19 virus infection   Severe sepsis (HCC)   Paroxysmal atrial fibrillation (HCC)   Aspiration pneumonia due to gastric secretions (HCC)   1. Acute on chronic respiratory failure hypoxia plan is for 16-hour goal today on pressure support 12/5 FiO2 28%.  Continue aggressive pulmonary toilet supportive measures. 2. Cardiac arrest rhythm stable 3. COVID-19 virus infection no change 4. Severe sepsis hemodynamics are stable 5. Paroxysmal atrial fibrillation rate controlled 6. Aspiration pneumonia treated we will continue to follow   I have personally seen and evaluated the patient, evaluated laboratory and imaging results, formulated the assessment and plan and placed orders. The Patient requires high complexity decision making with multiple systems involvement.  Rounds were done with the Respiratory Therapy Director and Staff therapists and discussed with nursing staff also.  Yevonne Pax, MD [facility] Pulmonary Critical Care Medicine Sleep Medicine

## 2019-03-30 DIAGNOSIS — U071 COVID-19: Secondary | ICD-10-CM | POA: Diagnosis not present

## 2019-03-30 DIAGNOSIS — J690 Pneumonitis due to inhalation of food and vomit: Secondary | ICD-10-CM | POA: Diagnosis not present

## 2019-03-30 DIAGNOSIS — I469 Cardiac arrest, cause unspecified: Secondary | ICD-10-CM | POA: Diagnosis not present

## 2019-03-30 DIAGNOSIS — J9621 Acute and chronic respiratory failure with hypoxia: Secondary | ICD-10-CM | POA: Diagnosis not present

## 2019-03-30 MED ORDER — GENERIC EXTERNAL MEDICATION
Status: DC
Start: ? — End: 2019-03-30

## 2019-03-30 NOTE — Progress Notes (Addendum)
Pulmonary Critical Care Medicine [facility] [place]   PULMONARY CRITICAL CARE SERVICE  PROGRESS NOTE  Date of Service: 03/30/2019  Clint Strupp  [id number]:[id number]  DOB: [birth year]   DOA: 03/20/2019  Referring Physician: Carron Curie, MD  HPI: Shelley Hunter is a 54 y.o. female seen for follow up of Acute on Chronic Respiratory Failure.  Patient failed weaning to 55 today remains on full support on the vent assist-control mode rate 15 with FiO2 28% satting well at this time.  Medications: Reviewed on Rounds  Physical Exam:  Vitals: Pulse 80 respirations 22 BP 123/60 O2 sat 100% temp 97.5  Ventilator Settings ventilator mode AC VC rate 15 tidal volume 300 PEEP of 5 and FiO2 28%  . General: Comfortable at this time . Eyes: Grossly normal lids, irises & conjunctiva . ENT: grossly tongue is normal . Neck: no obvious mass . Cardiovascular: S1 S2 normal no gallop . Respiratory: No rales or rhonchi noted . Abdomen: soft . Skin: no rash seen on limited exam . Musculoskeletal: not rigid . Psychiatric:unable to assess . Neurologic: no seizure no involuntary movements         Lab Data:   Basic Metabolic Panel: Recent Labs  Lab 03/26/19 0643  NA 146*  K 4.7  CL 97*  CO2 37*  GLUCOSE 96  BUN 20  CREATININE 0.41*  CALCIUM 10.9*    ABG: No results for input(s): PHART, PCO2ART, PO2ART, HCO3, O2SAT in the last 168 hours.  Liver Function Tests: No results for input(s): AST, ALT, ALKPHOS, BILITOT, PROT, ALBUMIN in the last 168 hours. No results for input(s): LIPASE, AMYLASE in the last 168 hours. No results for input(s): AMMONIA in the last 168 hours.  CBC: Recent Labs  Lab 03/26/19 0643  WBC 5.8  HGB 8.3*  HCT 28.8*  MCV 98.0  PLT 413*    Cardiac Enzymes: No results for input(s): CKTOTAL, CKMB, CKMBINDEX, TROPONINI in the last 168 hours.  BNP (last 3 results) No results for input(s): BNP in the last 8760 hours.  ProBNP (last 3 results) No results for  input(s): PROBNP in the last 8760 hours.  Radiological Exams: No results found.  Assessment/Plan Active Problems:   Acute on chronic respiratory failure with hypoxia (HCC)   Cardiac arrest (HCC)   COVID-19 virus infection   Severe sepsis (HCC)   Paroxysmal atrial fibrillation (HCC)   Aspiration pneumonia due to gastric secretions (HCC)   1. Acute on chronic respiratory failure hypoxia plan is to continue weaning however patient did fail NAG trial today.  Continue full support at this time rest at night and attempt weaning again tomorrow.  Continue supportive measures and pulmonary toilet. 2. Cardiac arrest rhythm stable 3. COVID-19 virus infection no change 4. Severe sepsis hemodynamics are stable 5. Paroxysmal atrial fibrillation rate controlled 6. Aspiration pneumonia treated we will continue to follow   I have personally seen and evaluated the patient, evaluated laboratory and imaging results, formulated the assessment and plan and placed orders. The Patient requires high complexity decision making with multiple systems involvement.  Rounds were done with the Respiratory Therapy Director and Staff therapists and discussed with nursing staff also.  Yevonne Pax, MD [facility] Pulmonary Critical Care Medicine Sleep Medicine

## 2019-03-31 DIAGNOSIS — U071 COVID-19: Secondary | ICD-10-CM | POA: Diagnosis not present

## 2019-03-31 DIAGNOSIS — I469 Cardiac arrest, cause unspecified: Secondary | ICD-10-CM | POA: Diagnosis not present

## 2019-03-31 DIAGNOSIS — J9621 Acute and chronic respiratory failure with hypoxia: Secondary | ICD-10-CM | POA: Diagnosis not present

## 2019-03-31 DIAGNOSIS — J690 Pneumonitis due to inhalation of food and vomit: Secondary | ICD-10-CM | POA: Diagnosis not present

## 2019-03-31 NOTE — Progress Notes (Signed)
Pulmonary Critical Care Medicine [facility] [place]   PULMONARY CRITICAL CARE SERVICE  PROGRESS NOTE  Date of Service: 03/31/2019  Shelley Hunter  [id number]  DOB: 05/05/1965   DOA: 03/20/2019  Referring Physician: Carron Curie, MD  HPI: Shelley Hunter is a 54 y.o. female seen for follow up of Acute on Chronic Respiratory Failure.  Patient is on the NAG currently is comfortable right now without distress tolerating it well  Medications: Reviewed on Rounds  Physical Exam:  Vitals: Temperature is 98.1 pulse 95 respiratory rate 21 blood pressure 96/58 saturations 94%  Ventilator Settings off the ventilator on the NAG  . General: Comfortable at this time . Eyes: Grossly normal lids, irises & conjunctiva . ENT: grossly tongue is normal . Neck: no obvious mass . Cardiovascular: S1 S2 normal no gallop . Respiratory: Scattered rhonchi expansion is equal . Abdomen: soft . Skin: no rash seen on limited exam . Musculoskeletal: not rigid . Psychiatric:unable to assess . Neurologic: no seizure no involuntary movements         Lab Data:   Basic Metabolic Panel: Recent Labs  Lab 03/26/19 0643  NA 146*  K 4.7  CL 97*  CO2 37*  GLUCOSE 96  BUN 20  CREATININE 0.41*  CALCIUM 10.9*    ABG: No results for input(s): PHART, PCO2ART, PO2ART, HCO3, O2SAT in the last 168 hours.  Liver Function Tests: No results for input(s): AST, ALT, ALKPHOS, BILITOT, PROT, ALBUMIN in the last 168 hours. No results for input(s): LIPASE, AMYLASE in the last 168 hours. No results for input(s): AMMONIA in the last 168 hours.  CBC: Recent Labs  Lab 03/26/19 0643  WBC 5.8  HGB 8.3*  HCT 28.8*  MCV 98.0  PLT 413*    Cardiac Enzymes: No results for input(s): CKTOTAL, CKMB, CKMBINDEX, TROPONINI in the last 168 hours.  BNP (last 3 results) No results for input(s): BNP in the last 8760 hours.  ProBNP (last 3 results) No results for input(s): PROBNP in the last 8760  hours.  Radiological Exams: No results found.  Assessment/Plan Active Problems:   Acute on chronic respiratory failure with hypoxia (HCC)   Cardiac arrest (HCC)   COVID-19 virus infection   Severe sepsis (HCC)   Paroxysmal atrial fibrillation (HCC)   Aspiration pneumonia due to gastric secretions (HCC)   1. Acute on chronic respiratory failure hypoxia plan is to continue on the NAG wean as tolerated 2. Cardiac arrest rhythm has been stable 3. COVID-19 virus infection now in resolution phase 4. Severe sepsis resolved 5. Paroxysmal atrial fibrillation rate controlled 6. Aspiration pneumonia treated we will continue to follow   I have personally seen and evaluated the patient, evaluated laboratory and imaging results, formulated the assessment and plan and placed orders. The Patient requires high complexity decision making with multiple systems involvement.  Rounds were done with the Respiratory Therapy Director and Staff therapists and discussed with nursing staff also.  Yevonne Pax, MD [facility] Pulmonary Critical Care Medicine Sleep Medicine

## 2019-04-01 ENCOUNTER — Other Ambulatory Visit (HOSPITAL_COMMUNITY): Payer: Medicare Other

## 2019-04-01 DIAGNOSIS — I469 Cardiac arrest, cause unspecified: Secondary | ICD-10-CM | POA: Diagnosis not present

## 2019-04-01 DIAGNOSIS — J9621 Acute and chronic respiratory failure with hypoxia: Secondary | ICD-10-CM | POA: Diagnosis not present

## 2019-04-01 DIAGNOSIS — U071 COVID-19: Secondary | ICD-10-CM | POA: Diagnosis not present

## 2019-04-01 DIAGNOSIS — J690 Pneumonitis due to inhalation of food and vomit: Secondary | ICD-10-CM | POA: Diagnosis not present

## 2019-04-01 LAB — TROPONIN I (HIGH SENSITIVITY)
Troponin I (High Sensitivity): 24 ng/L — ABNORMAL HIGH (ref <18)
Troponin I (High Sensitivity): 45 ng/L — ABNORMAL HIGH (ref <18)
Troponin I (High Sensitivity): 63 ng/L — ABNORMAL HIGH (ref <18)

## 2019-04-01 LAB — BLOOD GAS, ARTERIAL
Acid-Base Excess: 10.1 mmol/L — ABNORMAL HIGH (ref 0.0–2.0)
Acid-Base Excess: 9.7 mmol/L — ABNORMAL HIGH (ref 0.0–2.0)
Acid-Base Excess: 11.5 mmol/L — ABNORMAL HIGH (ref 0.0–2.0)
Bicarbonate: 41.5 mmol/L — ABNORMAL HIGH (ref 20.0–28.0)
Bicarbonate: 36.7 mmol/L — ABNORMAL HIGH (ref 20.0–28.0)
Bicarbonate: 36.4 mmol/L — ABNORMAL HIGH (ref 20.0–28.0)
FIO2: 24
FIO2: 28
FIO2: 28
O2 Saturation: 98.6 %
O2 Saturation: 91 %
O2 Saturation: 92 %
Patient temperature: 37
Patient temperature: 37
Patient temperature: 39
pCO2 arterial: >120 mmHg (ref 32.0–48.0)
pCO2 arterial: 84.5 mmHg (ref 32.0–48.0)
pCO2 arterial: 61.7 mmHg — ABNORMAL HIGH (ref 32.0–48.0)
pH, Arterial: 7.001 — CL (ref 7.350–7.450)
pH, Arterial: 7.261 — ABNORMAL LOW (ref 7.350–7.450)
pH, Arterial: 7.398 (ref 7.350–7.450)
pO2, Arterial: 170 mmHg — ABNORMAL HIGH (ref 83.0–108.0)
pO2, Arterial: 61 mmHg — ABNORMAL LOW (ref 83.0–108.0)
pO2, Arterial: 61.2 mmHg — ABNORMAL LOW (ref 83.0–108.0)

## 2019-04-01 LAB — CBC
HCT: 34.4 % — ABNORMAL LOW (ref 36.0–46.0)
Hemoglobin: 9.7 g/dL — ABNORMAL LOW (ref 12.0–15.0)
MCH: 29.6 pg (ref 26.0–34.0)
MCHC: 28.2 g/dL — ABNORMAL LOW (ref 30.0–36.0)
MCV: 104.9 fL — ABNORMAL HIGH (ref 80.0–100.0)
Platelets: 383 10*3/uL (ref 150–400)
RBC: 3.28 MIL/uL — ABNORMAL LOW (ref 3.87–5.11)
RDW: 15.1 % (ref 11.5–15.5)
WBC: 16.4 10*3/uL — ABNORMAL HIGH (ref 4.0–10.5)
nRBC: 0.4 % — ABNORMAL HIGH (ref 0.0–0.2)

## 2019-04-01 LAB — URINALYSIS, ROUTINE W REFLEX MICROSCOPIC
Bilirubin Urine: NEGATIVE
Glucose, UA: NEGATIVE mg/dL
Ketones, ur: NEGATIVE mg/dL
Nitrite: NEGATIVE
Protein, ur: NEGATIVE mg/dL
RBC / HPF: >50 RBC/hpf — ABNORMAL HIGH (ref 0–5)
Specific Gravity, Urine: 1.014 (ref 1.005–1.030)
pH: 6 (ref 5.0–8.0)

## 2019-04-01 LAB — LACTIC ACID, PLASMA
Lactic Acid, Venous: 2.2 mmol/L (ref 0.5–1.9)
Lactic Acid, Venous: 2.1 mmol/L (ref 0.5–1.9)

## 2019-04-01 LAB — COMPREHENSIVE METABOLIC PANEL
ALT: 35 U/L (ref 0–44)
AST: 42 U/L — ABNORMAL HIGH (ref 15–41)
Albumin: 2.9 g/dL — ABNORMAL LOW (ref 3.5–5.0)
Alkaline Phosphatase: 126 U/L (ref 38–126)
Anion gap: 10 (ref 5–15)
BUN: 24 mg/dL — ABNORMAL HIGH (ref 6–20)
CO2: 36 mmol/L — ABNORMAL HIGH (ref 22–32)
Calcium: 11.1 mg/dL — ABNORMAL HIGH (ref 8.9–10.3)
Chloride: 105 mmol/L (ref 98–111)
Creatinine, Ser: 0.38 mg/dL — ABNORMAL LOW (ref 0.44–1.00)
GFR calc Af Amer: >60 mL/min (ref >60)
GFR calc non Af Amer: >60 mL/min (ref >60)
Glucose, Bld: 208 mg/dL — ABNORMAL HIGH (ref 70–99)
Potassium: 5.2 mmol/L — ABNORMAL HIGH (ref 3.5–5.1)
Sodium: 151 mmol/L — ABNORMAL HIGH (ref 135–145)
Total Bilirubin: 0.1 mg/dL — ABNORMAL LOW (ref 0.3–1.2)
Total Protein: 8.1 g/dL (ref 6.5–8.1)

## 2019-04-01 LAB — PHOSPHORUS: Phosphorus: 5.5 mg/dL — ABNORMAL HIGH (ref 2.5–4.6)

## 2019-04-01 LAB — PROTIME-INR
INR: 1.2 (ref 0.8–1.2)
Prothrombin Time: 15.3 seconds — ABNORMAL HIGH (ref 11.4–15.2)

## 2019-04-01 LAB — APTT: aPTT: 34 seconds (ref 24–36)

## 2019-04-01 LAB — MAGNESIUM: Magnesium: 2.3 mg/dL (ref 1.7–2.4)

## 2019-04-01 LAB — AMMONIA: Ammonia: 50 umol/L — ABNORMAL HIGH (ref 9–35)

## 2019-04-01 MED ORDER — GENERIC EXTERNAL MEDICATION
Status: DC
Start: ? — End: 2019-04-01

## 2019-04-01 NOTE — Progress Notes (Signed)
Pulmonary Critical Care Medicine [facility] [place]   PULMONARY CRITICAL CARE SERVICE  PROGRESS NOTE  Date of Service: 04/01/2019  Shelley Hunter  WUJ:[id number]  DOB: 03/20/1965   DOA: 03/20/2019  Referring Physician: Carron Curie, MD  HPI: Shelley Hunter is a 54 y.o. female seen for follow up of Acute on Chronic Respiratory Failure.  Patient had to be placed back on the ventilator because she was having increased work of breathing ABG was done which showed pH of 7.00 and after being placed back on the ventilator pH did improve with the most recent ABG showing a pH of 7.39.  Right now she will be rested on the ventilator on assist control mode  Medications: Reviewed on Rounds  Physical Exam:  Vitals: Temperature 96.5 pulse 93 respiratory 24 blood pressure is 124/71 saturations 100%  Ventilator Settings on assist control with FiO2 28% tidal volume 382 PEEP 5  . General: Comfortable at this time . Eyes: Grossly normal lids, irises & conjunctiva . ENT: grossly tongue is normal . Neck: no obvious mass . Cardiovascular: S1 S2 normal no gallop . Respiratory: Scattered rhonchi expansion is equal . Abdomen: soft . Skin: no rash seen on limited exam . Musculoskeletal: not rigid . Psychiatric:unable to assess . Neurologic: no seizure no involuntary movements         Lab Data:   Basic Metabolic Panel: Recent Labs  Lab 03/26/19 0643 04/01/19 0623  NA 146* 151*  K 4.7 5.2*  CL 97* 105  CO2 37* 36*  GLUCOSE 96 208*  BUN 20 24*  CREATININE 0.41* 0.38*  CALCIUM 10.9* 11.1*  MG  --  2.3  PHOS  --  5.5*    ABG: Recent Labs  Lab 04/01/19 0530 04/01/19 0650 04/01/19 1018  PHART 7.001* 7.261* 7.398  PCO2ART >120* 84.5* 61.7*  PO2ART 170* 61.0* 61.2*  HCO3 41.5* 36.7* 36.4*  O2SAT 98.6 91.0 92.0    Liver Function Tests: Recent Labs  Lab 04/01/19 0623  AST 42*  ALT 35  ALKPHOS 126  BILITOT 0.1*  PROT 8.1  ALBUMIN 2.9*   No results for input(s): LIPASE,  AMYLASE in the last 168 hours. Recent Labs  Lab 04/01/19 0635  AMMONIA 50*    CBC: Recent Labs  Lab 03/26/19 0643 04/01/19 0623  WBC 5.8 16.4*  HGB 8.3* 9.7*  HCT 28.8* 34.4*  MCV 98.0 104.9*  PLT 413* 383    Cardiac Enzymes: No results for input(s): CKTOTAL, CKMB, CKMBINDEX, TROPONINI in the last 168 hours.  BNP (last 3 results) No results for input(s): BNP in the last 8760 hours.  ProBNP (last 3 results) No results for input(s): PROBNP in the last 8760 hours.  Radiological Exams: DG Chest Port 1 View  Result Date: 04/01/2019 CLINICAL DATA:  Stroke EXAM: PORTABLE CHEST 1 VIEW COMPARISON:  None. FINDINGS: Low volume chest with diffuse hazy opacity on the right and streaky density at the left base. Normal heart size for low volumes. Tracheostomy tube is present. No evidence of effusion or pneumothorax. Left upper rib fractures. IMPRESSION: Low volume chest with hazy and streaky opacity likely reflecting atelectasis. Electronically Signed   By: Marnee Spring M.D.   On: 04/01/2019 06:31    Assessment/Plan Active Problems:   Acute on chronic respiratory failure with hypoxia (HCC)   Cardiac arrest (HCC)   COVID-19 virus infection   Severe sepsis (HCC)   Paroxysmal atrial fibrillation (HCC)   Aspiration pneumonia due to gastric secretions (HCC)   1. Acute on  chronic respiratory failure hypoxia plan is to continue with assist control FiO2 28% tidal line 382 PEEP 5 patient's ABG looked really bad this morning but now is much improved will hold off on doing any weaning 2. COVID-19 virus infection treated we will continue with supportive care 3. Severe sepsis hemodynamics are stable 4. Paroxysmal atrial fibrillation rate controlled 5. Aspiration pneumonia treated follow-up chest x-ray showed low volumes with some streakiness and some atelectasis but no distinct infiltrate was noted.   I have personally seen and evaluated the patient, evaluated laboratory and imaging results,  formulated the assessment and plan and placed orders. The Patient requires high complexity decision making with multiple systems involvement.  Rounds were done with the Respiratory Therapy Director and Staff therapists and discussed with nursing staff also.  Time 35 minutes acute change in status  Allyne Gee, MD [facility] Pulmonary Critical Care Medicine Sleep Medicine

## 2019-04-02 ENCOUNTER — Other Ambulatory Visit (HOSPITAL_COMMUNITY): Payer: Medicare Other

## 2019-04-02 DIAGNOSIS — J690 Pneumonitis due to inhalation of food and vomit: Secondary | ICD-10-CM | POA: Diagnosis not present

## 2019-04-02 DIAGNOSIS — U071 COVID-19: Secondary | ICD-10-CM | POA: Diagnosis not present

## 2019-04-02 DIAGNOSIS — I469 Cardiac arrest, cause unspecified: Secondary | ICD-10-CM | POA: Diagnosis not present

## 2019-04-02 DIAGNOSIS — J9621 Acute and chronic respiratory failure with hypoxia: Secondary | ICD-10-CM | POA: Diagnosis not present

## 2019-04-02 LAB — BASIC METABOLIC PANEL
Anion gap: 8 (ref 5–15)
BUN: 21 mg/dL — ABNORMAL HIGH (ref 6–20)
CO2: 30 mmol/L (ref 22–32)
Calcium: 9.6 mg/dL (ref 8.9–10.3)
Chloride: 108 mmol/L (ref 98–111)
Creatinine, Ser: 0.46 mg/dL (ref 0.44–1.00)
GFR calc Af Amer: >60 mL/min (ref >60)
GFR calc non Af Amer: >60 mL/min (ref >60)
Glucose, Bld: 98 mg/dL (ref 70–99)
Potassium: 3.3 mmol/L — ABNORMAL LOW (ref 3.5–5.1)
Sodium: 146 mmol/L — ABNORMAL HIGH (ref 135–145)

## 2019-04-02 LAB — CBC
HCT: 24.3 % — ABNORMAL LOW (ref 36.0–46.0)
Hemoglobin: 7 g/dL — ABNORMAL LOW (ref 12.0–15.0)
MCH: 28.7 pg (ref 26.0–34.0)
MCHC: 28.8 g/dL — ABNORMAL LOW (ref 30.0–36.0)
MCV: 99.6 fL (ref 80.0–100.0)
Platelets: 234 10*3/uL (ref 150–400)
RBC: 2.44 MIL/uL — ABNORMAL LOW (ref 3.87–5.11)
RDW: 15.3 % (ref 11.5–15.5)
WBC: 5.6 10*3/uL (ref 4.0–10.5)
nRBC: 0 % (ref 0.0–0.2)

## 2019-04-02 MED ORDER — GENERIC EXTERNAL MEDICATION
Status: DC
Start: ? — End: 2019-04-02

## 2019-04-02 NOTE — Progress Notes (Addendum)
Pulmonary Critical Care Medicine [place]   PULMONARY CRITICAL CARE SERVICE  PROGRESS NOTE  Date of Service: 04/02/2019  Shelley Hunter  BWL:[id number]  DOB: [birth year]   DOA: 03/20/2019  Referring Physician: Merton Border, MD  HPI: Shelley Hunter is a 54 y.o. female seen for follow up of Acute on Chronic Respiratory Failure.  Patient remains on full support ventilator at this time assist-control mode rate 15 with FiO2 28%.  Medications: Reviewed on Rounds  Physical Exam:  Vitals: Pulse 82 respirations 26 BP 114/58 O2 sat 100% temp 98.8  Ventilator Settings ventilator mode AC VC rate of 15 tidal 1 300 PEEP of 5 FiO2 28%  . General: Comfortable at this time . Eyes: Grossly normal lids, irises & conjunctiva . ENT: grossly tongue is normal . Neck: no obvious mass . Cardiovascular: S1 S2 normal no gallop . Respiratory: No rales or rhonchi noted . Abdomen: soft . Skin: no rash seen on limited exam . Musculoskeletal: not rigid . Psychiatric:unable to assess . Neurologic: no seizure no involuntary movements         Lab Data:   Basic Metabolic Panel: Recent Labs  Lab 04/01/19 0623 04/02/19 0921  NA 151* 146*  K 5.2* 3.3*  CL 105 108  CO2 36* 30  GLUCOSE 208* 98  BUN 24* 21*  CREATININE 0.38* 0.46  CALCIUM 11.1* 9.6  MG 2.3  --   PHOS 5.5*  --     ABG: Recent Labs  Lab 04/01/19 0530 04/01/19 0650 04/01/19 1018  PHART 7.001* 7.261* 7.398  PCO2ART >120* 84.5* 61.7*  PO2ART 170* 61.0* 61.2*  HCO3 41.5* 36.7* 36.4*  O2SAT 98.6 91.0 92.0    Liver Function Tests: Recent Labs  Lab 04/01/19 0623  AST 42*  ALT 35  ALKPHOS 126  BILITOT 0.1*  PROT 8.1  ALBUMIN 2.9*   No results for input(s): LIPASE, AMYLASE in the last 168 hours. Recent Labs  Lab 04/01/19 0635  AMMONIA 50*    CBC: Recent Labs  Lab 04/01/19 0623 04/02/19 0921  WBC 16.4* 5.6  HGB 9.7* 7.0*  HCT 34.4* 24.3*  MCV 104.9* 99.6  PLT 383 234    Cardiac Enzymes: No  results for input(s): CKTOTAL, CKMB, CKMBINDEX, TROPONINI in the last 168 hours.  BNP (last 3 results) No results for input(s): BNP in the last 8760 hours.  ProBNP (last 3 results) No results for input(s): PROBNP in the last 8760 hours.  Radiological Exams: CT HEAD WO CONTRAST  Result Date: 04/02/2019 CLINICAL DATA:  Cerebral hemorrhage suspect, respiratory failure, on ventilator, possible sepsis, COVID-19 infection, history atrial fibrillation EXAM: CT HEAD WITHOUT CONTRAST TECHNIQUE: Contiguous axial images were obtained from the base of the skull through the vertex without intravenous contrast. Sagittal and coronal MPR images reconstructed from axial data set. COMPARISON:  None. FINDINGS: Brain: Generalized atrophy. Normal ventricular morphology. No midline shift or mass effect. Scattered artifacts. No definite intracranial hemorrhage, mass lesion, or evidence of acute infarction. No extra-axial fluid collections. Vascular: No hyperdense vessels Skull: Hyperostosis frontalis interna. Mild diffuse calvarial osseous thickening. No focally bone lesions. Sinuses/Orbits: Clear Other: N/A IMPRESSION: No acute intracranial abnormalities identified. Electronically Signed   By: Lavonia Dana M.D.   On: 04/02/2019 10:22   DG Chest Port 1 View  Result Date: 04/01/2019 CLINICAL DATA:  Stroke EXAM: PORTABLE CHEST 1 VIEW COMPARISON:  None. FINDINGS: Low volume chest with diffuse hazy opacity on the right and streaky density at the left base. Normal heart size for  low volumes. Tracheostomy tube is present. No evidence of effusion or pneumothorax. Left upper rib fractures. IMPRESSION: Low volume chest with hazy and streaky opacity likely reflecting atelectasis. Electronically Signed   By: Marnee Spring M.D.   On: 04/01/2019 06:31    Assessment/Plan Active Problems:   Acute on chronic respiratory failure with hypoxia (HCC)   Cardiac arrest (HCC)   COVID-19 virus infection   Severe sepsis (HCC)   Paroxysmal  atrial fibrillation (HCC)   Aspiration pneumonia due to gastric secretions (HCC)   1. Acute on chronic respiratory failure hypoxia plan is to continue with full support ventilator assist control mode rate of 15 tidal 100 100 PEEP of 5 and FiO2 20%.  We will hold off on weaning at this time.  Continue supportive measures and pulmonary toilet. 2. COVID-19 virus infection treated we will continue with supportive care 3. Severe sepsis hemodynamics are stable 4. Paroxysmal atrial fibrillation rate controlled 5. Aspiration pneumonia treated follow-up chest x-ray showed low volumes with some streakiness and some atelectasis but no distinct infiltrate was noted.   I have personally seen and evaluated the patient, evaluated laboratory and imaging results, formulated the assessment and plan and placed orders. The Patient requires high complexity decision making with multiple systems involvement.  Rounds were done with the Respiratory Therapy Director and Staff therapists and discussed with nursing staff also.  Yevonne Pax, MD [facility] Pulmonary Critical Care Medicine Sleep Medicine

## 2019-04-03 DIAGNOSIS — I469 Cardiac arrest, cause unspecified: Secondary | ICD-10-CM | POA: Diagnosis not present

## 2019-04-03 DIAGNOSIS — J690 Pneumonitis due to inhalation of food and vomit: Secondary | ICD-10-CM | POA: Diagnosis not present

## 2019-04-03 DIAGNOSIS — J9621 Acute and chronic respiratory failure with hypoxia: Secondary | ICD-10-CM | POA: Diagnosis not present

## 2019-04-03 DIAGNOSIS — U071 COVID-19: Secondary | ICD-10-CM | POA: Diagnosis not present

## 2019-04-03 LAB — CBC
HCT: 25.9 % — ABNORMAL LOW (ref 36.0–46.0)
Hemoglobin: 7.3 g/dL — ABNORMAL LOW (ref 12.0–15.0)
MCH: 28.5 pg (ref 26.0–34.0)
MCHC: 28.2 g/dL — ABNORMAL LOW (ref 30.0–36.0)
MCV: 101.2 fL — ABNORMAL HIGH (ref 80.0–100.0)
Platelets: 225 10*3/uL (ref 150–400)
RBC: 2.56 MIL/uL — ABNORMAL LOW (ref 3.87–5.11)
RDW: 15.8 % — ABNORMAL HIGH (ref 11.5–15.5)
WBC: 4 10*3/uL (ref 4.0–10.5)
nRBC: 0 % (ref 0.0–0.2)

## 2019-04-03 LAB — CULTURE, RESPIRATORY W GRAM STAIN
Culture: NORMAL
Special Requests: NORMAL

## 2019-04-03 NOTE — Progress Notes (Signed)
Pulmonary Critical Care Medicine [place]   PULMONARY CRITICAL CARE SERVICE  PROGRESS NOTE  Date of Service: 04/03/2019  Shelley Hunter  [id number]  DOB: 16-Jul-1965   DOA: 03/20/2019  Referring Physician: Merton Border, MD  HPI: Shelley Hunter is a 54 y.o. female seen for follow up of Acute on Chronic Respiratory Failure.  Patient is on full support on assist control mode currently on 28% FiO2  Medications: Reviewed on Rounds  Physical Exam:  Vitals: Temperature is 97.0 pulse 75 respiratory 15 blood pressure is 105/59 saturations 100%  Ventilator Settings on assist control FiO2 20% tidal volume 322 PEEP 5  . General: Comfortable at this time . Eyes: Grossly normal lids, irises & conjunctiva . ENT: grossly tongue is normal . Neck: no obvious mass . Cardiovascular: S1 S2 normal no gallop . Respiratory: Coarse breath sounds with few rhonchi . Abdomen: soft . Skin: no rash seen on limited exam . Musculoskeletal: not rigid . Psychiatric:unable to assess . Neurologic: no seizure no involuntary movements         Lab Data:   Basic Metabolic Panel: Recent Labs  Lab 04/01/19 0623 04/02/19 0921  NA 151* 146*  K 5.2* 3.3*  CL 105 108  CO2 36* 30  GLUCOSE 208* 98  BUN 24* 21*  CREATININE 0.38* 0.46  CALCIUM 11.1* 9.6  MG 2.3  --   PHOS 5.5*  --     ABG: Recent Labs  Lab 04/01/19 0530 04/01/19 0650 04/01/19 1018  PHART 7.001* 7.261* 7.398  PCO2ART >120* 84.5* 61.7*  PO2ART 170* 61.0* 61.2*  HCO3 41.5* 36.7* 36.4*  O2SAT 98.6 91.0 92.0    Liver Function Tests: Recent Labs  Lab 04/01/19 0623  AST 42*  ALT 35  ALKPHOS 126  BILITOT 0.1*  PROT 8.1  ALBUMIN 2.9*   No results for input(s): LIPASE, AMYLASE in the last 168 hours. Recent Labs  Lab 04/01/19 0635  AMMONIA 50*    CBC: Recent Labs  Lab 04/01/19 0623 04/02/19 0921 04/03/19 0526  WBC 16.4* 5.6 4.0  HGB 9.7* 7.0* 7.3*  HCT 34.4* 24.3* 25.9*  MCV 104.9* 99.6 101.2*   PLT 383 234 225    Cardiac Enzymes: No results for input(s): CKTOTAL, CKMB, CKMBINDEX, TROPONINI in the last 168 hours.  BNP (last 3 results) No results for input(s): BNP in the last 8760 hours.  ProBNP (last 3 results) No results for input(s): PROBNP in the last 8760 hours.  Radiological Exams: CT HEAD WO CONTRAST  Result Date: 04/02/2019 CLINICAL DATA:  Cerebral hemorrhage suspect, respiratory failure, on ventilator, possible sepsis, COVID-19 infection, history atrial fibrillation EXAM: CT HEAD WITHOUT CONTRAST TECHNIQUE: Contiguous axial images were obtained from the base of the skull through the vertex without intravenous contrast. Sagittal and coronal MPR images reconstructed from axial data set. COMPARISON:  None. FINDINGS: Brain: Generalized atrophy. Normal ventricular morphology. No midline shift or mass effect. Scattered artifacts. No definite intracranial hemorrhage, mass lesion, or evidence of acute infarction. No extra-axial fluid collections. Vascular: No hyperdense vessels Skull: Hyperostosis frontalis interna. Mild diffuse calvarial osseous thickening. No focally bone lesions. Sinuses/Orbits: Clear Other: N/A IMPRESSION: No acute intracranial abnormalities identified. Electronically Signed   By: Lavonia Dana M.D.   On: 04/02/2019 10:22    Assessment/Plan Active Problems:   Acute on chronic respiratory failure with hypoxia (HCC)   Cardiac arrest (HCC)   COVID-19 virus infection   Severe sepsis (HCC)   Paroxysmal atrial fibrillation (HCC)   Aspiration pneumonia due to  gastric secretions (HCC)   1. Acute on chronic respiratory failure hypoxia plan is to continue with full support on the ventilator check the RSB I for weaning readiness 2. Cardiac arrest rhythm stable 3. COVID-19 virus infection in resolution phase 4. Severe sepsis hemodynamics are stable 5. Paroxysmal atrial fibrillation rate controlled 6. Aspiration pneumonia treated we will continue to follow.   I  have personally seen and evaluated the patient, evaluated laboratory and imaging results, formulated the assessment and plan and placed orders. The Patient requires high complexity decision making with multiple systems involvement.  Rounds were done with the Respiratory Therapy Director and Staff therapists and discussed with nursing staff also.  Yevonne Pax, MD [facility] Pulmonary Critical Care Medicine Sleep Medicine

## 2019-04-04 DIAGNOSIS — U071 COVID-19: Secondary | ICD-10-CM | POA: Diagnosis not present

## 2019-04-04 DIAGNOSIS — J690 Pneumonitis due to inhalation of food and vomit: Secondary | ICD-10-CM | POA: Diagnosis not present

## 2019-04-04 DIAGNOSIS — I469 Cardiac arrest, cause unspecified: Secondary | ICD-10-CM | POA: Diagnosis not present

## 2019-04-04 DIAGNOSIS — J9621 Acute and chronic respiratory failure with hypoxia: Secondary | ICD-10-CM | POA: Diagnosis not present

## 2019-04-04 LAB — URINE CULTURE
Culture: >=100000 — AB
Special Requests: NORMAL

## 2019-04-04 LAB — CBC
HCT: 22.9 % — ABNORMAL LOW (ref 36.0–46.0)
HCT: 30.2 % — ABNORMAL LOW (ref 36.0–46.0)
Hemoglobin: 6.5 g/dL — CL (ref 12.0–15.0)
Hemoglobin: 9 g/dL — ABNORMAL LOW (ref 12.0–15.0)
MCH: 28.5 pg (ref 26.0–34.0)
MCH: 28.8 pg (ref 26.0–34.0)
MCHC: 28.4 g/dL — ABNORMAL LOW (ref 30.0–36.0)
MCHC: 29.8 g/dL — ABNORMAL LOW (ref 30.0–36.0)
MCV: 100.4 fL — ABNORMAL HIGH (ref 80.0–100.0)
MCV: 96.5 fL (ref 80.0–100.0)
Platelets: 194 10*3/uL (ref 150–400)
Platelets: 183 10*3/uL (ref 150–400)
RBC: 2.28 MIL/uL — ABNORMAL LOW (ref 3.87–5.11)
RBC: 3.13 MIL/uL — ABNORMAL LOW (ref 3.87–5.11)
RDW: 16.2 % — ABNORMAL HIGH (ref 11.5–15.5)
RDW: 17.4 % — ABNORMAL HIGH (ref 11.5–15.5)
WBC: 3.9 10*3/uL — ABNORMAL LOW (ref 4.0–10.5)
WBC: 4.1 10*3/uL (ref 4.0–10.5)
nRBC: 0.5 % — ABNORMAL HIGH (ref 0.0–0.2)
nRBC: 0.5 % — ABNORMAL HIGH (ref 0.0–0.2)

## 2019-04-04 LAB — BASIC METABOLIC PANEL
Anion gap: 8 (ref 5–15)
BUN: 10 mg/dL (ref 6–20)
CO2: 29 mmol/L (ref 22–32)
Calcium: 9.7 mg/dL (ref 8.9–10.3)
Chloride: 111 mmol/L (ref 98–111)
Creatinine, Ser: 0.39 mg/dL — ABNORMAL LOW (ref 0.44–1.00)
GFR calc Af Amer: >60 mL/min (ref >60)
GFR calc non Af Amer: >60 mL/min (ref >60)
Glucose, Bld: 93 mg/dL (ref 70–99)
Potassium: 3.6 mmol/L (ref 3.5–5.1)
Sodium: 148 mmol/L — ABNORMAL HIGH (ref 135–145)

## 2019-04-04 LAB — ABO/RH: ABO/RH(D): A POS

## 2019-04-04 LAB — PREPARE RBC (CROSSMATCH)

## 2019-04-04 LAB — OCCULT BLOOD X 1 CARD TO LAB, STOOL: Fecal Occult Bld: POSITIVE — AB

## 2019-04-04 MED ORDER — GENERIC EXTERNAL MEDICATION
Status: DC
Start: ? — End: 2019-04-04

## 2019-04-04 NOTE — Progress Notes (Addendum)
Pulmonary Critical Care Medicine [facility] [place]   PULMONARY CRITICAL CARE SERVICE  PROGRESS NOTE  Date of Service: 04/04/2019  Shelley Hunter  UXN:[id number]  DOB: 06/06/1965   DOA: 03/20/2019  Referring Physician: Carron Curie, MD  HPI: Shelley Hunter is a 54 y.o. female seen for follow up of Acute on Chronic Respiratory Failure.  Patient unable to wean today hemoglobin was down to 6.5 resting on full support ventilator at this time.  Medications: Reviewed on Rounds  Physical Exam:  Vitals: Pulse 73 respirations 23 BP 110/58 O2 sat 100% temp 98.5  Ventilator Settings ventilator mode AC VC rate of 15 tidal volume 300 PEEP of 5 FiO2 28%  . General: Comfortable at this time . Eyes: Grossly normal lids, irises & conjunctiva . ENT: grossly tongue is normal . Neck: no obvious mass . Cardiovascular: S1 S2 normal no gallop . Respiratory: No rales or rhonchi noted . Abdomen: soft . Skin: no rash seen on limited exam . Musculoskeletal: not rigid . Psychiatric:unable to assess . Neurologic: no seizure no involuntary movements         Lab Data:   Basic Metabolic Panel: Recent Labs  Lab 04/01/19 0623 04/02/19 0921 04/04/19 0510  NA 151* 146* 148*  K 5.2* 3.3* 3.6  CL 105 108 111  CO2 36* 30 29  GLUCOSE 208* 98 93  BUN 24* 21* 10  CREATININE 0.38* 0.46 0.39*  CALCIUM 11.1* 9.6 9.7  MG 2.3  --   --   PHOS 5.5*  --   --     ABG: Recent Labs  Lab 04/01/19 0530 04/01/19 0650 04/01/19 1018  PHART 7.001* 7.261* 7.398  PCO2ART >120* 84.5* 61.7*  PO2ART 170* 61.0* 61.2*  HCO3 41.5* 36.7* 36.4*  O2SAT 98.6 91.0 92.0    Liver Function Tests: Recent Labs  Lab 04/01/19 0623  AST 42*  ALT 35  ALKPHOS 126  BILITOT 0.1*  PROT 8.1  ALBUMIN 2.9*   No results for input(s): LIPASE, AMYLASE in the last 168 hours. Recent Labs  Lab 04/01/19 0635  AMMONIA 50*    CBC: Recent Labs  Lab 04/01/19 0623 04/02/19 0921 04/03/19 0526 04/04/19 0510  WBC 16.4*  5.6 4.0 3.9*  HGB 9.7* 7.0* 7.3* 6.5*  HCT 34.4* 24.3* 25.9* 22.9*  MCV 104.9* 99.6 101.2* 100.4*  PLT 383 234 225 194    Cardiac Enzymes: No results for input(s): CKTOTAL, CKMB, CKMBINDEX, TROPONINI in the last 168 hours.  BNP (last 3 results) No results for input(s): BNP in the last 8760 hours.  ProBNP (last 3 results) No results for input(s): PROBNP in the last 8760 hours.  Radiological Exams: No results found.  Assessment/Plan Active Problems:   Acute on chronic respiratory failure with hypoxia (HCC)   Cardiac arrest (HCC)   COVID-19 virus infection   Severe sepsis (HCC)   Paroxysmal atrial fibrillation (HCC)   Aspiration pneumonia due to gastric secretions (HCC)   1. Acute on chronic respiratory failure hypoxia plan this is now full support at this time when hemoglobin returns to normal we will continue to attempt weaning per protocol.  Continue supportive measures and pulmonary toilet. 2. Cardiac arrest rhythm stable 3. COVID-19 virus infection in resolution phase 4. Severe sepsis hemodynamics are stable 5. Paroxysmal atrial fibrillation rate controlled 6. Aspiration pneumonia treated we will continue to follow.   I have personally seen and evaluated the patient, evaluated laboratory and imaging results, formulated the assessment and plan and placed orders. The Patient requires high  complexity decision making with multiple systems involvement.  Rounds were done with the Respiratory Therapy Director and Staff therapists and discussed with nursing staff also.  Allyne Gee, MD [facility] Pulmonary Critical Care Medicine Sleep Medicine

## 2019-04-05 DIAGNOSIS — I469 Cardiac arrest, cause unspecified: Secondary | ICD-10-CM | POA: Diagnosis not present

## 2019-04-05 DIAGNOSIS — J9621 Acute and chronic respiratory failure with hypoxia: Secondary | ICD-10-CM | POA: Diagnosis not present

## 2019-04-05 DIAGNOSIS — J690 Pneumonitis due to inhalation of food and vomit: Secondary | ICD-10-CM | POA: Diagnosis not present

## 2019-04-05 DIAGNOSIS — U071 COVID-19: Secondary | ICD-10-CM | POA: Diagnosis not present

## 2019-04-05 LAB — BPAM RBC
Blood Product Expiration Date: 202103152359
ISSUE DATE / TIME: 202102121200
Unit Type and Rh: 6200

## 2019-04-05 LAB — CBC
HCT: 31.3 % — ABNORMAL LOW (ref 36.0–46.0)
Hemoglobin: 9.1 g/dL — ABNORMAL LOW (ref 12.0–15.0)
MCH: 28.3 pg (ref 26.0–34.0)
MCHC: 29.1 g/dL — ABNORMAL LOW (ref 30.0–36.0)
MCV: 97.5 fL (ref 80.0–100.0)
Platelets: 176 10*3/uL (ref 150–400)
RBC: 3.21 MIL/uL — ABNORMAL LOW (ref 3.87–5.11)
RDW: 17.5 % — ABNORMAL HIGH (ref 11.5–15.5)
WBC: 4.4 10*3/uL (ref 4.0–10.5)
nRBC: 0 % (ref 0.0–0.2)

## 2019-04-05 LAB — TYPE AND SCREEN
ABO/RH(D): A POS
Antibody Screen: NEGATIVE
Unit division: 0

## 2019-04-05 LAB — OCCULT BLOOD X 1 CARD TO LAB, STOOL: Fecal Occult Bld: POSITIVE — AB

## 2019-04-05 NOTE — Progress Notes (Addendum)
Pulmonary Critical Care Medicine [facility] [place]   PULMONARY CRITICAL CARE SERVICE  PROGRESS NOTE  Date of Service: 04/05/2019  Shelley Hunter  [id number]:[id number]  DOB: [birth year]   DOA: 03/20/2019  Referring Physician: Carron Curie, MD  HPI: Shelley Hunter is a 54 y.o. female seen for follow up of Acute on Chronic Respiratory Failure.  Patient has a 4-hour goal now respiratory 5 FiO2 28% satting well at this time.    Medications: Reviewed on Rounds  Physical Exam:  Vitals: Pulse 66 respiration 22 BP 108/59 O2 sat 100% temp 97.6  Ventilator Settings pressure support 12/5 FiO2 20%  . General: Comfortable at this time . Eyes: Grossly normal lids, irises & conjunctiva . ENT: grossly tongue is normal . Neck: no obvious mass . Cardiovascular: S1 S2 normal no gallop . Respiratory: No rales or rhonchi noted . Abdomen: soft . Skin: no rash seen on limited exam . Musculoskeletal: not rigid . Psychiatric:unable to assess . Neurologic: no seizure no involuntary movements         Lab Data:   Basic Metabolic Panel: Recent Labs  Lab 04/01/19 0623 04/02/19 0921 04/04/19 0510  NA 151* 146* 148*  K 5.2* 3.3* 3.6  CL 105 108 111  CO2 36* 30 29  GLUCOSE 208* 98 93  BUN 24* 21* 10  CREATININE 0.38* 0.46 0.39*  CALCIUM 11.1* 9.6 9.7  MG 2.3  --   --   PHOS 5.5*  --   --     ABG: Recent Labs  Lab 04/01/19 0530 04/01/19 0650 04/01/19 1018  PHART 7.001* 7.261* 7.398  PCO2ART >120* 84.5* 61.7*  PO2ART 170* 61.0* 61.2*  HCO3 41.5* 36.7* 36.4*  O2SAT 98.6 91.0 92.0    Liver Function Tests: Recent Labs  Lab 04/01/19 0623  AST 42*  ALT 35  ALKPHOS 126  BILITOT 0.1*  PROT 8.1  ALBUMIN 2.9*   No results for input(s): LIPASE, AMYLASE in the last 168 hours. Recent Labs  Lab 04/01/19 0635  AMMONIA 50*    CBC: Recent Labs  Lab 04/02/19 0921 04/03/19 0526 04/04/19 0510 04/04/19 1919 04/05/19 0659  WBC 5.6 4.0 3.9* 4.1 4.4  HGB 7.0* 7.3* 6.5* 9.0* 9.1*   HCT 24.3* 25.9* 22.9* 30.2* 31.3*  MCV 99.6 101.2* 100.4* 96.5 97.5  PLT 234 225 194 183 176    Cardiac Enzymes: No results for input(s): CKTOTAL, CKMB, CKMBINDEX, TROPONINI in the last 168 hours.  BNP (last 3 results) No results for input(s): BNP in the last 8760 hours.  ProBNP (last 3 results) No results for input(s): PROBNP in the last 8760 hours.  Radiological Exams: No results found.  Assessment/Plan Active Problems:   Acute on chronic respiratory failure with hypoxia (HCC)   Cardiac arrest (HCC)   COVID-19 virus infection   Severe sepsis (HCC)   Paroxysmal atrial fibrillation (HCC)   Aspiration pneumonia due to gastric secretions (HCC)   1. Acute on chronic respiratory failure hypoxia plan is to wean pressure support today for goal 4 hours.  Continue supportive measures and pulmonary toilet. 2. Cardiac arrest rhythm stable 3. COVID-19 virus infection in resolution phase 4. Severe sepsis hemodynamics are stable 5. Paroxysmal atrial fibrillation rate controlled 6. Aspiration pneumonia treated we will continue to follow.   I have personally seen and evaluated the patient, evaluated laboratory and imaging results, formulated the assessment and plan and placed orders. The Patient requires high complexity decision making with multiple systems involvement.  Rounds were done with the Respiratory Therapy  Librarian, academic therapists and discussed with nursing staff also.  Allyne Gee, MD [facility] Pulmonary Critical Care Medicine Sleep Medicine

## 2019-04-06 DIAGNOSIS — J690 Pneumonitis due to inhalation of food and vomit: Secondary | ICD-10-CM | POA: Diagnosis not present

## 2019-04-06 DIAGNOSIS — I469 Cardiac arrest, cause unspecified: Secondary | ICD-10-CM | POA: Diagnosis not present

## 2019-04-06 DIAGNOSIS — J9621 Acute and chronic respiratory failure with hypoxia: Secondary | ICD-10-CM | POA: Diagnosis not present

## 2019-04-06 DIAGNOSIS — U071 COVID-19: Secondary | ICD-10-CM | POA: Diagnosis not present

## 2019-04-06 LAB — BASIC METABOLIC PANEL
Anion gap: 6 (ref 5–15)
BUN: 8 mg/dL (ref 6–20)
CO2: 31 mmol/L (ref 22–32)
Calcium: 10.3 mg/dL (ref 8.9–10.3)
Chloride: 110 mmol/L (ref 98–111)
Creatinine, Ser: 0.46 mg/dL (ref 0.44–1.00)
GFR calc Af Amer: >60 mL/min (ref >60)
GFR calc non Af Amer: >60 mL/min (ref >60)
Glucose, Bld: 101 mg/dL — ABNORMAL HIGH (ref 70–99)
Potassium: 5 mmol/L (ref 3.5–5.1)
Sodium: 147 mmol/L — ABNORMAL HIGH (ref 135–145)

## 2019-04-06 LAB — CULTURE, BLOOD (ROUTINE X 2)
Culture: NO GROWTH
Culture: NO GROWTH
Special Requests: ADEQUATE
Special Requests: ADEQUATE

## 2019-04-06 LAB — CBC
HCT: 32.8 % — ABNORMAL LOW (ref 36.0–46.0)
Hemoglobin: 9.6 g/dL — ABNORMAL LOW (ref 12.0–15.0)
MCH: 28.8 pg (ref 26.0–34.0)
MCHC: 29.3 g/dL — ABNORMAL LOW (ref 30.0–36.0)
MCV: 98.5 fL (ref 80.0–100.0)
Platelets: 184 10*3/uL (ref 150–400)
RBC: 3.33 MIL/uL — ABNORMAL LOW (ref 3.87–5.11)
RDW: 17.3 % — ABNORMAL HIGH (ref 11.5–15.5)
WBC: 4.5 10*3/uL (ref 4.0–10.5)
nRBC: 0 % (ref 0.0–0.2)

## 2019-04-06 MED ORDER — GENERIC EXTERNAL MEDICATION
Status: DC
Start: ? — End: 2019-04-06

## 2019-04-06 NOTE — Progress Notes (Addendum)
Pulmonary Critical Care Medicine [place]   PULMONARY CRITICAL CARE SERVICE  PROGRESS NOTE  Date of Service: 04/06/2019  Shelley Hunter  [id number]  DOB: Nov 28, 1965   DOA: 03/20/2019  Referring Physician: Merton Border, MD  HPI: Shelley Hunter is a 54 y.o. female seen for follow up of Acute on Chronic Respiratory Failure.  Patient remains on pressure support at this time 12/5 with an FiO2 of 28% has a 4-hour goal today.  Satting well no distress.  Medications: Reviewed on Rounds  Physical Exam:  Vitals: Pulse 56 respirations 15 BP 94/55 O2 sat 100% temp 98.1  Ventilator Settings pressure support 12/5 FiO2 25  . General: Comfortable at this time . Eyes: Grossly normal lids, irises & conjunctiva . ENT: grossly tongue is normal . Neck: no obvious mass . Cardiovascular: S1 S2 normal no gallop . Respiratory: No rales or rhonchi noted . Abdomen: soft . Skin: no rash seen on limited exam . Musculoskeletal: not rigid . Psychiatric:unable to assess . Neurologic: no seizure no involuntary movements         Lab Data:   Basic Metabolic Panel: Recent Labs  Lab 04/01/19 0623 04/02/19 0921 04/04/19 0510 04/06/19 0814  NA 151* 146* 148* 147*  K 5.2* 3.3* 3.6 5.0  CL 105 108 111 110  CO2 36* 30 29 31   GLUCOSE 208* 98 93 101*  BUN 24* 21* 10 8  CREATININE 0.38* 0.46 0.39* 0.46  CALCIUM 11.1* 9.6 9.7 10.3  MG 2.3  --   --   --   PHOS 5.5*  --   --   --     ABG: Recent Labs  Lab 04/01/19 0530 04/01/19 0650 04/01/19 1018  PHART 7.001* 7.261* 7.398  PCO2ART >120* 84.5* 61.7*  PO2ART 170* 61.0* 61.2*  HCO3 41.5* 36.7* 36.4*  O2SAT 98.6 91.0 92.0    Liver Function Tests: Recent Labs  Lab 04/01/19 0623  AST 42*  ALT 35  ALKPHOS 126  BILITOT 0.1*  PROT 8.1  ALBUMIN 2.9*   No results for input(s): LIPASE, AMYLASE in the last 168 hours. Recent Labs  Lab 04/01/19 0635  AMMONIA 50*    CBC: Recent Labs  Lab 04/03/19 0526 04/04/19 0510  04/04/19 1919 04/05/19 0659 04/06/19 0814  WBC 4.0 3.9* 4.1 4.4 4.5  HGB 7.3* 6.5* 9.0* 9.1* 9.6*  HCT 25.9* 22.9* 30.2* 31.3* 32.8*  MCV 101.2* 100.4* 96.5 97.5 98.5  PLT 225 194 183 176 184    Cardiac Enzymes: No results for input(s): CKTOTAL, CKMB, CKMBINDEX, TROPONINI in the last 168 hours.  BNP (last 3 results) No results for input(s): BNP in the last 8760 hours.  ProBNP (last 3 results) No results for input(s): PROBNP in the last 8760 hours.  Radiological Exams: No results found.  Assessment/Plan Active Problems:   Acute on chronic respiratory failure with hypoxia (HCC)   Cardiac arrest (HCC)   COVID-19 virus infection   Severe sepsis (HCC)   Paroxysmal atrial fibrillation (HCC)   Aspiration pneumonia due to gastric secretions ([place])   1. Acute on chronic respiratory failure hypoxia plan is to wean pressure support today for goal 4 hours.  Continue supportive measures and pulmonary toilet. 2. Cardiac arrest rhythm stable 3. COVID-19 virus infection in resolution phase 4. Severe sepsis hemodynamics are stable 5. Paroxysmal atrial fibrillation rate controlled 6. Aspiration pneumonia treated we will continue to follow.   I have personally seen and evaluated the patient, evaluated laboratory and imaging results, formulated the assessment and  plan and placed orders. The Patient requires high complexity decision making with multiple systems involvement.  Rounds were done with the Respiratory Therapy Director and Staff therapists and discussed with nursing staff also.  Yevonne Pax, MD [facility] Pulmonary Critical Care Medicine Sleep Medicine \

## 2019-04-07 DIAGNOSIS — J690 Pneumonitis due to inhalation of food and vomit: Secondary | ICD-10-CM | POA: Diagnosis not present

## 2019-04-07 DIAGNOSIS — U071 COVID-19: Secondary | ICD-10-CM | POA: Diagnosis not present

## 2019-04-07 DIAGNOSIS — J9621 Acute and chronic respiratory failure with hypoxia: Secondary | ICD-10-CM | POA: Diagnosis not present

## 2019-04-07 DIAGNOSIS — I469 Cardiac arrest, cause unspecified: Secondary | ICD-10-CM | POA: Diagnosis not present

## 2019-04-07 NOTE — Progress Notes (Signed)
Pulmonary Critical Care Medicine [place]   PULMONARY CRITICAL CARE SERVICE  PROGRESS NOTE  Date of Service: 04/07/2019  Shelley Hunter  UXN:[id number]  DOB: November 08, 1965   DOA: 03/20/2019  Referring Physician: Merton Border, MD  HPI: Shelley Hunter is a 54 y.o. female seen for follow up of Acute on Chronic Respiratory Failure.  Patient at this time is on pressure support mode comfortable right now without distress has been on 28% FiO2 the goal is for 16 hours  Medications: Reviewed on Rounds  Physical Exam:  Vitals: Temperature 97.6 pulse 62 respiratory rate 17 blood pressure is 106/62 saturations 100%  Ventilator Settings on pressure support FiO2 is 28% pressure support 12 PEEP 5  . General: Comfortable at this time . Eyes: Grossly normal lids, irises & conjunctiva . ENT: grossly tongue is normal . Neck: no obvious mass . Cardiovascular: S1 S2 normal no gallop . Respiratory: Scattered rhonchi expansion is equal . Abdomen: soft . Skin: no rash seen on limited exam . Musculoskeletal: not rigid . Psychiatric:unable to assess . Neurologic: no seizure no involuntary movements         Lab Data:   Basic Metabolic Panel: Recent Labs  Lab 04/01/19 0623 04/02/19 0921 04/04/19 0510 04/06/19 0814  NA 151* 146* 148* 147*  K 5.2* 3.3* 3.6 5.0  CL 105 108 111 110  CO2 36* 30 29 31   GLUCOSE 208* 98 93 101*  BUN 24* 21* 10 8  CREATININE 0.38* 0.46 0.39* 0.46  CALCIUM 11.1* 9.6 9.7 10.3  MG 2.3  --   --   --   PHOS 5.5*  --   --   --     ABG: Recent Labs  Lab 04/01/19 0530 04/01/19 0650 04/01/19 1018  PHART 7.001* 7.261* 7.398  PCO2ART >120* 84.5* 61.7*  PO2ART 170* 61.0* 61.2*  HCO3 41.5* 36.7* 36.4*  O2SAT 98.6 91.0 92.0    Liver Function Tests: Recent Labs  Lab 04/01/19 0623  AST 42*  ALT 35  ALKPHOS 126  BILITOT 0.1*  PROT 8.1  ALBUMIN 2.9*   No results for input(s): LIPASE, AMYLASE in the last 168 hours. Recent Labs  Lab  04/01/19 0635  AMMONIA 50*    CBC: Recent Labs  Lab 04/03/19 0526 04/04/19 0510 04/04/19 1919 04/05/19 0659 04/06/19 0814  WBC 4.0 3.9* 4.1 4.4 4.5  HGB 7.3* 6.5* 9.0* 9.1* 9.6*  HCT 25.9* 22.9* 30.2* 31.3* 32.8*  MCV 101.2* 100.4* 96.5 97.5 98.5  PLT 225 194 183 176 184    Cardiac Enzymes: No results for input(s): CKTOTAL, CKMB, CKMBINDEX, TROPONINI in the last 168 hours.  BNP (last 3 results) No results for input(s): BNP in the last 8760 hours.  ProBNP (last 3 results) No results for input(s): PROBNP in the last 8760 hours.  Radiological Exams: No results found.  Assessment/Plan Active Problems:   Acute on chronic respiratory failure with hypoxia (HCC)   Cardiac arrest (HCC)   COVID-19 virus infection   Severe sepsis (HCC)   Paroxysmal atrial fibrillation (HCC)   Aspiration pneumonia due to gastric secretions ([place])   1. Acute on chronic respiratory failure with hypoxia patient will be continued on pressure support mode 12/5 right now the goal is 16 hours 2. Cardiac arrest rhythm is stable at this time we will continue present management 3. COVID-19 virus infection treated resolving 4. Severe sepsis resolved 5. Paroxysmal atrial fibrillation rate is controlled we will continue present management 6. Aspiration pneumonia treated we will continue to  follow along   I have personally seen and evaluated the patient, evaluated laboratory and imaging results, formulated the assessment and plan and placed orders. The Patient requires high complexity decision making with multiple systems involvement.  Rounds were done with the Respiratory Therapy Director and Staff therapists and discussed with nursing staff also.  Yevonne Pax, MD [facility] Pulmonary Critical Care Medicine Sleep Medicine

## 2019-04-08 DIAGNOSIS — J690 Pneumonitis due to inhalation of food and vomit: Secondary | ICD-10-CM | POA: Diagnosis not present

## 2019-04-08 DIAGNOSIS — J9621 Acute and chronic respiratory failure with hypoxia: Secondary | ICD-10-CM | POA: Diagnosis not present

## 2019-04-08 DIAGNOSIS — I469 Cardiac arrest, cause unspecified: Secondary | ICD-10-CM | POA: Diagnosis not present

## 2019-04-08 DIAGNOSIS — U071 COVID-19: Secondary | ICD-10-CM | POA: Diagnosis not present

## 2019-04-08 MED ORDER — GENERIC EXTERNAL MEDICATION
Status: DC
Start: ? — End: 2019-04-08

## 2019-04-08 NOTE — Progress Notes (Signed)
Pulmonary Critical Care Medicine [facility] [place]   PULMONARY CRITICAL CARE SERVICE  PROGRESS NOTE  Date of Service: 04/08/2019  Shelley Hunter  [id number]  DOB: 12/05/1965   DOA: 03/20/2019  Referring Physician: Carron Curie, MD  HPI: Shelley Hunter is a 54 y.o. female seen for follow up of Acute on Chronic Respiratory Failure.  Patient is currently on pressure support has been on 28% FiO2 12/5  Medications: Reviewed on Rounds  Physical Exam:  Vitals: Temperature is 97.3 pulse 67 respiratory rate 14 blood pressure is 100/70 saturations 98%  Ventilator Settings on pressure support 28% FiO2 pressure support 12 PEEP 5  . General: Comfortable at this time . Eyes: Grossly normal lids, irises & conjunctiva . ENT: grossly tongue is normal . Neck: no obvious mass . Cardiovascular: S1 S2 normal no gallop . Respiratory: No rhonchi no rales are noted at this time . Abdomen: soft . Skin: no rash seen on limited exam . Musculoskeletal: not rigid . Psychiatric:unable to assess . Neurologic: no seizure no involuntary movements         Lab Data:   Basic Metabolic Panel: Recent Labs  Lab 04/02/19 0921 04/04/19 0510 04/06/19 0814  NA 146* 148* 147*  K 3.3* 3.6 5.0  CL 108 111 110  CO2 30 29 31   GLUCOSE 98 93 101*  BUN 21* 10 8  CREATININE 0.46 0.39* 0.46  CALCIUM 9.6 9.7 10.3    ABG: No results for input(s): PHART, PCO2ART, PO2ART, HCO3, O2SAT in the last 168 hours.  Liver Function Tests: No results for input(s): AST, ALT, ALKPHOS, BILITOT, PROT, ALBUMIN in the last 168 hours. No results for input(s): LIPASE, AMYLASE in the last 168 hours. No results for input(s): AMMONIA in the last 168 hours.  CBC: Recent Labs  Lab 04/03/19 0526 04/04/19 0510 04/04/19 1919 04/05/19 0659 04/06/19 0814  WBC 4.0 3.9* 4.1 4.4 4.5  HGB 7.3* 6.5* 9.0* 9.1* 9.6*  HCT 25.9* 22.9* 30.2* 31.3* 32.8*  MCV 101.2* 100.4* 96.5 97.5 98.5  PLT 225 194 183 176 184    Cardiac  Enzymes: No results for input(s): CKTOTAL, CKMB, CKMBINDEX, TROPONINI in the last 168 hours.  BNP (last 3 results) No results for input(s): BNP in the last 8760 hours.  ProBNP (last 3 results) No results for input(s): PROBNP in the last 8760 hours.  Radiological Exams: No results found.  Assessment/Plan Active Problems:   Acute on chronic respiratory failure with hypoxia (HCC)   Cardiac arrest (HCC)   COVID-19 virus infection   Severe sepsis (HCC)   Paroxysmal atrial fibrillation (HCC)   Aspiration pneumonia due to gastric secretions (HCC)   1. Acute on chronic respiratory failure hypoxia plan is to continue with weaning on pressure support currently is on 28% FiO2 we will continue to advance as tolerated 2. COVID-19 virus infection treated we will continue with present management 3. Cardiac arrest rhythm is stable 4. Severe sepsis hemodynamics are stable 5. Paroxysmal atrial fibrillation rate controlled 6. Aspiration pneumonia treated we will continue with present management   I have personally seen and evaluated the patient, evaluated laboratory and imaging results, formulated the assessment and plan and placed orders. The Patient requires high complexity decision making with multiple systems involvement.  Rounds were done with the Respiratory Therapy Director and Staff therapists and discussed with nursing staff also.  04/08/19, MD [facility] Pulmonary Critical Care Medicine Sleep Medicine

## 2019-04-09 DIAGNOSIS — U071 COVID-19: Secondary | ICD-10-CM | POA: Diagnosis not present

## 2019-04-09 DIAGNOSIS — I469 Cardiac arrest, cause unspecified: Secondary | ICD-10-CM | POA: Diagnosis not present

## 2019-04-09 DIAGNOSIS — J690 Pneumonitis due to inhalation of food and vomit: Secondary | ICD-10-CM | POA: Diagnosis not present

## 2019-04-09 DIAGNOSIS — J9621 Acute and chronic respiratory failure with hypoxia: Secondary | ICD-10-CM | POA: Diagnosis not present

## 2019-04-09 LAB — BASIC METABOLIC PANEL
Anion gap: 5 (ref 5–15)
BUN: 9 mg/dL (ref 6–20)
CO2: 33 mmol/L — ABNORMAL HIGH (ref 22–32)
Calcium: 10.3 mg/dL (ref 8.9–10.3)
Chloride: 106 mmol/L (ref 98–111)
Creatinine, Ser: 0.42 mg/dL — ABNORMAL LOW (ref 0.44–1.00)
GFR calc Af Amer: >60 mL/min (ref >60)
GFR calc non Af Amer: >60 mL/min (ref >60)
Glucose, Bld: 95 mg/dL (ref 70–99)
Potassium: 4.6 mmol/L (ref 3.5–5.1)
Sodium: 144 mmol/L (ref 135–145)

## 2019-04-09 LAB — CBC
HCT: 29.8 % — ABNORMAL LOW (ref 36.0–46.0)
Hemoglobin: 8.9 g/dL — ABNORMAL LOW (ref 12.0–15.0)
MCH: 28.9 pg (ref 26.0–34.0)
MCHC: 29.9 g/dL — ABNORMAL LOW (ref 30.0–36.0)
MCV: 96.8 fL (ref 80.0–100.0)
Platelets: 151 10*3/uL (ref 150–400)
RBC: 3.08 MIL/uL — ABNORMAL LOW (ref 3.87–5.11)
RDW: 16.7 % — ABNORMAL HIGH (ref 11.5–15.5)
WBC: 4.3 10*3/uL (ref 4.0–10.5)
nRBC: 0 % (ref 0.0–0.2)

## 2019-04-09 NOTE — Progress Notes (Signed)
Pulmonary Critical Care Medicine [facility] [place]   PULMONARY CRITICAL CARE SERVICE  PROGRESS NOTE  Date of Service: 04/09/2019  Shelley Hunter  [id number]  DOB: 06-09-1965   DOA: 03/20/2019  Referring Physician: Carron Curie, MD  HPI: Shelley Hunter is a 54 y.o. female seen for follow up of Acute on Chronic Respiratory Failure.  Patient is resting comfortably right now without distress on the ventilator was on pressure support 12/5 apparently on home ventilation patient was doing nocturnal vent support so were going to try to have the family bring in her home ventilator so that we can start using it here while she is in the hospital.  Medications: Reviewed on Rounds  Physical Exam:  Vitals: Temperature 96.8 pulse 54 respiratory 18 blood pressure is 98/56 saturations 100%  Ventilator Settings mode ventilation pressure support FiO2 28% pressure 12 PEEP 5  . General: Comfortable at this time . Eyes: Grossly normal lids, irises & conjunctiva . ENT: grossly tongue is normal . Neck: no obvious mass . Cardiovascular: S1 S2 normal no gallop . Respiratory: No rhonchi no rales are noted at this time . Abdomen: soft . Skin: no rash seen on limited exam . Musculoskeletal: not rigid . Psychiatric:unable to assess . Neurologic: no seizure no involuntary movements         Lab Data:   Basic Metabolic Panel: Recent Labs  Lab 04/04/19 0510 04/06/19 0814 04/09/19 0547  NA 148* 147* 144  K 3.6 5.0 4.6  CL 111 110 106  CO2 29 31 33*  GLUCOSE 93 101* 95  BUN 10 8 9   CREATININE 0.39* 0.46 0.42*  CALCIUM 9.7 10.3 10.3    ABG: No results for input(s): PHART, PCO2ART, PO2ART, HCO3, O2SAT in the last 168 hours.  Liver Function Tests: No results for input(s): AST, ALT, ALKPHOS, BILITOT, PROT, ALBUMIN in the last 168 hours. No results for input(s): LIPASE, AMYLASE in the last 168 hours. No results for input(s): AMMONIA in the last 168 hours.  CBC: Recent Labs  Lab  04/04/19 0510 04/04/19 1919 04/05/19 0659 04/06/19 0814 04/09/19 0547  WBC 3.9* 4.1 4.4 4.5 4.3  HGB 6.5* 9.0* 9.1* 9.6* 8.9*  HCT 22.9* 30.2* 31.3* 32.8* 29.8*  MCV 100.4* 96.5 97.5 98.5 96.8  PLT 194 183 176 184 151    Cardiac Enzymes: No results for input(s): CKTOTAL, CKMB, CKMBINDEX, TROPONINI in the last 168 hours.  BNP (last 3 results) No results for input(s): BNP in the last 8760 hours.  ProBNP (last 3 results) No results for input(s): PROBNP in the last 8760 hours.  Radiological Exams: No results found.  Assessment/Plan Active Problems:   Acute on chronic respiratory failure with hypoxia (HCC)   Cardiac arrest (HCC)   COVID-19 virus infection   Severe sepsis (HCC)   Paroxysmal atrial fibrillation (HCC)   Aspiration pneumonia due to gastric secretions (HCC)   1. Acute on chronic respiratory failure with hypoxia plan is going to be to continue to wean on pressure support mode will try 12 hours and then will try to also get her home ventilator in so that we can start using it 2. Cardiac arrest rhythm stable 3. COVID-19 virus infection resolved ARDS we will continue with supportive care 4. Severe sepsis hemodynamics are stable 5. Paroxysmal atrial fibrillation rate controlled 6. Aspiration pneumonia treated improving we will continue to follow    I have personally seen and evaluated the patient, evaluated laboratory and imaging results, formulated the assessment and plan and placed orders.  The Patient requires high complexity decision making with multiple systems involvement.  Rounds were done with the Respiratory Therapy Director and Staff therapists and discussed with nursing staff also.  Allyne Gee, MD [facility] Pulmonary Critical Care Medicine Sleep Medicine

## 2019-04-10 DIAGNOSIS — U071 COVID-19: Secondary | ICD-10-CM | POA: Diagnosis not present

## 2019-04-10 DIAGNOSIS — I469 Cardiac arrest, cause unspecified: Secondary | ICD-10-CM | POA: Diagnosis not present

## 2019-04-10 DIAGNOSIS — J690 Pneumonitis due to inhalation of food and vomit: Secondary | ICD-10-CM | POA: Diagnosis not present

## 2019-04-10 DIAGNOSIS — J9621 Acute and chronic respiratory failure with hypoxia: Secondary | ICD-10-CM | POA: Diagnosis not present

## 2019-04-10 LAB — BLOOD GAS, ARTERIAL
Acid-Base Excess: 5.1 mmol/L — ABNORMAL HIGH (ref 0.0–2.0)
Acid-Base Excess: 6.7 mmol/L — ABNORMAL HIGH (ref 0.0–2.0)
Bicarbonate: 32 mmol/L — ABNORMAL HIGH (ref 20.0–28.0)
Bicarbonate: 31.3 mmol/L — ABNORMAL HIGH (ref 20.0–28.0)
FIO2: 28
FIO2: 28
O2 Saturation: 90.2 %
O2 Saturation: 99.2 %
Patient temperature: 36.3
Patient temperature: 36.3
pCO2 arterial: 74.5 mmHg (ref 32.0–48.0)
pCO2 arterial: 51 mmHg — ABNORMAL HIGH (ref 32.0–48.0)
pH, Arterial: 7.251 — ABNORMAL LOW (ref 7.350–7.450)
pH, Arterial: 7.405 (ref 7.350–7.450)
pO2, Arterial: 65.9 mmHg — ABNORMAL LOW (ref 83.0–108.0)
pO2, Arterial: 127 mmHg — ABNORMAL HIGH (ref 83.0–108.0)

## 2019-04-10 MED ORDER — GENERIC EXTERNAL MEDICATION
Status: DC
Start: ? — End: 2019-04-10

## 2019-04-10 NOTE — Progress Notes (Signed)
Pulmonary Critical Care Medicine [facility] [place]   PULMONARY CRITICAL CARE SERVICE  PROGRESS NOTE  Date of Service: 04/10/2019  Shelley Hunter  [id number]  DOB: 1965/07/05   DOA: 03/20/2019  Referring Physician: Carron Curie, MD  HPI: Shelley Hunter is a 54 y.o. female seen for follow up of Acute on Chronic Respiratory Failure.  Patient is currently on pressure support has been on 28% FiO2 and currently is on 12/5  Medications: Reviewed on Rounds  Physical Exam:  Vitals: Temperature is 97.9 pulse 66 respiratory rate 21 blood pressure is 121/65 saturations 99%  Ventilator Settings mode ventilation pressure support FiO2 is 20% pressure support 12 PEEP 5  . General: Comfortable at this time . Eyes: Grossly normal lids, irises & conjunctiva . ENT: grossly tongue is normal . Neck: no obvious mass . Cardiovascular: S1 S2 normal no gallop . Respiratory: No rhonchi coarse breath sounds . Abdomen: soft . Skin: no rash seen on limited exam . Musculoskeletal: not rigid . Psychiatric:unable to assess . Neurologic: no seizure no involuntary movements         Lab Data:   Basic Metabolic Panel: Recent Labs  Lab 04/04/19 0510 04/06/19 0814 04/09/19 0547  NA 148* 147* 144  K 3.6 5.0 4.6  CL 111 110 106  CO2 29 31 33*  GLUCOSE 93 101* 95  BUN 10 8 9   CREATININE 0.39* 0.46 0.42*  CALCIUM 9.7 10.3 10.3    ABG: No results for input(s): PHART, PCO2ART, PO2ART, HCO3, O2SAT in the last 168 hours.  Liver Function Tests: No results for input(s): AST, ALT, ALKPHOS, BILITOT, PROT, ALBUMIN in the last 168 hours. No results for input(s): LIPASE, AMYLASE in the last 168 hours. No results for input(s): AMMONIA in the last 168 hours.  CBC: Recent Labs  Lab 04/04/19 0510 04/04/19 1919 04/05/19 0659 04/06/19 0814 04/09/19 0547  WBC 3.9* 4.1 4.4 4.5 4.3  HGB 6.5* 9.0* 9.1* 9.6* 8.9*  HCT 22.9* 30.2* 31.3* 32.8* 29.8*  MCV 100.4* 96.5 97.5 98.5 96.8  PLT 194 183 176  184 151    Cardiac Enzymes: No results for input(s): CKTOTAL, CKMB, CKMBINDEX, TROPONINI in the last 168 hours.  BNP (last 3 results) No results for input(s): BNP in the last 8760 hours.  ProBNP (last 3 results) No results for input(s): PROBNP in the last 8760 hours.  Radiological Exams: No results found.  Assessment/Plan Active Problems:   Acute on chronic respiratory failure with hypoxia (HCC)   Cardiac arrest (HCC)   COVID-19 virus infection   Severe sepsis (HCC)   Paroxysmal atrial fibrillation (HCC)   Aspiration pneumonia due to gastric secretions (HCC)   1. Acute on chronic respiratory failure with hypoxia plan is to try to advance the weaning and we will place the patient on the NAG with oxygen bleeding 2. Cardiac arrest rhythm stable 3. COVID-19 virus infection resolved 4. Severe sepsis hemodynamics are stable 5. Paroxysmal atrial fibrillation rate controlled 6. Aspiration pneumonia treated clinically is improved   I have personally seen and evaluated the patient, evaluated laboratory and imaging results, formulated the assessment and plan and placed orders. The Patient requires high complexity decision making with multiple systems involvement.  Rounds were done with the Respiratory Therapy Director and Staff therapists and discussed with nursing staff also.  04/11/19, MD [facility] Pulmonary Critical Care Medicine Sleep Medicine

## 2019-04-11 DIAGNOSIS — I471 Supraventricular tachycardia: Secondary | ICD-10-CM

## 2019-04-11 DIAGNOSIS — J9621 Acute and chronic respiratory failure with hypoxia: Secondary | ICD-10-CM | POA: Diagnosis not present

## 2019-04-11 DIAGNOSIS — I469 Cardiac arrest, cause unspecified: Secondary | ICD-10-CM | POA: Diagnosis not present

## 2019-04-11 DIAGNOSIS — J690 Pneumonitis due to inhalation of food and vomit: Secondary | ICD-10-CM | POA: Diagnosis not present

## 2019-04-11 DIAGNOSIS — U071 COVID-19: Secondary | ICD-10-CM | POA: Diagnosis not present

## 2019-04-11 NOTE — Progress Notes (Signed)
Pulmonary Critical Care Medicine [place]   PULMONARY CRITICAL CARE SERVICE  PROGRESS NOTE  Date of Service: 04/11/2019  Shelley Hunter  [id number]  DOB: Aug 19, 1965   DOA: 03/20/2019  Referring Physician: Merton Border, MD  HPI: Shelley Hunter is a 54 y.o. female seen for follow up of Acute on Chronic Respiratory Failure. Apparently patient had a rapid response yesterday with increased heart rate noted.  ABG that was done at that time showed significant acidosis.  Patient was placed back on the ventilator now appears to be doing little bit better.  She is still however unresponsive  Medications: Reviewed on Rounds  Physical Exam:  Vitals: Temperature is 98.0 pulse 107 respiratory rate 23 blood pressure is 108/72 saturations 98%  Ventilator Settings mode ventilation assist control FiO2 28% tidal line 300 PEEP 5  . General: Comfortable at this time . Eyes: Grossly normal lids, irises & conjunctiva . ENT: grossly tongue is normal . Neck: no obvious mass . Cardiovascular: S1 S2 normal no gallop . Respiratory: Scattered rhonchi expansion is equal at this time . Abdomen: soft . Skin: no rash seen on limited exam . Musculoskeletal: not rigid . Psychiatric:unable to assess . Neurologic: no seizure no involuntary movements         Lab Data:   Basic Metabolic Panel: Recent Labs  Lab 04/06/19 0814 04/09/19 0547  NA 147* 144  K 5.0 4.6  CL 110 106  CO2 31 33*  GLUCOSE 101* 95  BUN 8 9  CREATININE 0.46 0.42*  CALCIUM 10.3 10.3    ABG: Recent Labs  Lab 04/10/19 1644 04/10/19 1825  PHART 7.251* 7.405  PCO2ART 74.5* 51.00*  PO2ART 65.9* 127*  HCO3 32.0* 31.3*  O2SAT 90.2 99.2    Liver Function Tests: No results for input(s): AST, ALT, ALKPHOS, BILITOT, PROT, ALBUMIN in the last 168 hours. No results for input(s): LIPASE, AMYLASE in the last 168 hours. No results for input(s): AMMONIA in the last 168 hours.  CBC: Recent Labs  Lab 04/04/19 1919  04/05/19 0659 04/06/19 0814 04/09/19 0547  WBC 4.1 4.4 4.5 4.3  HGB 9.0* 9.1* 9.6* 8.9*  HCT 30.2* 31.3* 32.8* 29.8*  MCV 96.5 97.5 98.5 96.8  PLT 183 176 184 151    Cardiac Enzymes: No results for input(s): CKTOTAL, CKMB, CKMBINDEX, TROPONINI in the last 168 hours.  BNP (last 3 results) No results for input(s): BNP in the last 8760 hours.  ProBNP (last 3 results) No results for input(s): PROBNP in the last 8760 hours.  Radiological Exams: No results found.  Assessment/Plan Active Problems:   Acute on chronic respiratory failure with hypoxia (HCC)   Cardiac arrest (HCC)   COVID-19 virus infection   Severe sepsis (HCC)   Paroxysmal atrial fibrillation (HCC)   Aspiration pneumonia due to gastric secretions ([place])   1. Acute on chronic respiratory failure hypoxia plan is to continue with the ventilator and full support right now hold off on doing any weaning attempts at this time until cardiac issues are sorted out 2. Cardiac arrest right now rhythm is stable however has been having periodic issues with tachycardia now back in sinus rhythm but tachycardic 3. COVID-19 virus infection in resolution phase 4. Severe sepsis this has resolved 5. Paroxysmal atrial fibrillation patient did have issues with tachycardia consider cardiology evaluation 6. Aspiration pneumonia treated we will continue to follow   I have personally seen and evaluated the patient, evaluated laboratory and imaging results, formulated the assessment and plan and placed  orders. The Patient requires high complexity decision making with multiple systems involvement.  Rounds were done with the Respiratory Therapy Director and Staff therapists and discussed with nursing staff also.  Time 35 minutes acute change in status  Yevonne Pax, MD [facility] Pulmonary Critical Care Medicine Sleep Medicine

## 2019-04-12 DIAGNOSIS — J9621 Acute and chronic respiratory failure with hypoxia: Secondary | ICD-10-CM | POA: Diagnosis not present

## 2019-04-12 DIAGNOSIS — U071 COVID-19: Secondary | ICD-10-CM | POA: Diagnosis not present

## 2019-04-12 DIAGNOSIS — J690 Pneumonitis due to inhalation of food and vomit: Secondary | ICD-10-CM | POA: Diagnosis not present

## 2019-04-12 DIAGNOSIS — I469 Cardiac arrest, cause unspecified: Secondary | ICD-10-CM | POA: Diagnosis not present

## 2019-04-12 NOTE — Progress Notes (Addendum)
Pulmonary Critical Care Medicine [facility] [place]   PULMONARY CRITICAL CARE SERVICE  PROGRESS NOTE  Date of Service: 04/12/2019  Shelley Hunter  [id number]  DOB: 24-Sep-1965   DOA: 03/20/2019  Referring Physician: Carron Curie, MD  HPI: Shelley Hunter is a 54 y.o. female seen for follow up of Acute on Chronic Respiratory Failure.  Patient mains on full support at this time satting well with no fever or distress.  Medications: Reviewed on Rounds  Physical Exam:  Vitals: Pulse 78 respirations 22 BP 123/76 O2 sat 99% temp 98.8  Ventilator Settings ventilator mode AC VC rate of 18 tidal line 300 PEEP of 5 FiO2 28%  . General: Comfortable at this time . Eyes: Grossly normal lids, irises & conjunctiva . ENT: grossly tongue is normal . Neck: no obvious mass . Cardiovascular: S1 S2 normal no gallop . Respiratory: No rales or rhonchi noted . Abdomen: soft . Skin: no rash seen on limited exam . Musculoskeletal: not rigid . Psychiatric:unable to assess . Neurologic: no seizure no involuntary movements         Lab Data:   Basic Metabolic Panel: Recent Labs  Lab 04/06/19 0814 04/09/19 0547  NA 147* 144  K 5.0 4.6  CL 110 106  CO2 31 33*  GLUCOSE 101* 95  BUN 8 9  CREATININE 0.46 0.42*  CALCIUM 10.3 10.3    ABG: Recent Labs  Lab 04/10/19 1644 04/10/19 1825  PHART 7.251* 7.405  PCO2ART 74.5* 51.00*  PO2ART 65.9* 127*  HCO3 32.0* 31.3*  O2SAT 90.2 99.2    Liver Function Tests: No results for input(s): AST, ALT, ALKPHOS, BILITOT, PROT, ALBUMIN in the last 168 hours. No results for input(s): LIPASE, AMYLASE in the last 168 hours. No results for input(s): AMMONIA in the last 168 hours.  CBC: Recent Labs  Lab 04/06/19 0814 04/09/19 0547  WBC 4.5 4.3  HGB 9.6* 8.9*  HCT 32.8* 29.8*  MCV 98.5 96.8  PLT 184 151    Cardiac Enzymes: No results for input(s): CKTOTAL, CKMB, CKMBINDEX, TROPONINI in the last 168 hours.  BNP (last 3 results) No  results for input(s): BNP in the last 8760 hours.  ProBNP (last 3 results) No results for input(s): PROBNP in the last 8760 hours.  Radiological Exams: No results found.  Assessment/Plan Active Problems:   Acute on chronic respiratory failure with hypoxia (HCC)   Cardiac arrest (HCC)   COVID-19 virus infection   Severe sepsis (HCC)   Paroxysmal atrial fibrillation (HCC)   Aspiration pneumonia due to gastric secretions (HCC)   1. Acute on chronic respiratory failure hypoxia continue full support on the ventilator at this time.  Continue aggressive pulmonary toilet supportive measures.  Patient is unable to wean at this time. 2. Cardiac arrest right now rhythm is stable however has been having periodic issues with tachycardia now back in sinus rhythm but tachycardic 3. COVID-19 virus infection in resolution phase 4. Severe sepsis this has resolved 5. Paroxysmal atrial fibrillation patient did have issues with tachycardia consider cardiology evaluation 6. Aspiration pneumonia treated we will continue to follow   I have personally seen and evaluated the patient, evaluated laboratory and imaging results, formulated the assessment and plan and placed orders. The Patient requires high complexity decision making with multiple systems involvement.  Rounds were done with the Respiratory Therapy Director and Staff therapists and discussed with nursing staff also.  Yevonne Pax, MD [facility] Pulmonary Critical Care Medicine Sleep Medicine

## 2019-04-13 DIAGNOSIS — J690 Pneumonitis due to inhalation of food and vomit: Secondary | ICD-10-CM | POA: Diagnosis not present

## 2019-04-13 DIAGNOSIS — J9621 Acute and chronic respiratory failure with hypoxia: Secondary | ICD-10-CM | POA: Diagnosis not present

## 2019-04-13 DIAGNOSIS — I469 Cardiac arrest, cause unspecified: Secondary | ICD-10-CM | POA: Diagnosis not present

## 2019-04-13 DIAGNOSIS — U071 COVID-19: Secondary | ICD-10-CM | POA: Diagnosis not present

## 2019-04-13 NOTE — Progress Notes (Addendum)
Pulmonary Critical Care Medicine [facility] [place]   PULMONARY CRITICAL CARE SERVICE  PROGRESS NOTE  Date of Service: 04/13/2019  [name]  NKN:[id number]  DOB: 03/22/1965   DOA: 03/20/2019  Referring Physician: Carron Curie, MD  HPI: Shelley Hunter is a 54 y.o. female seen for follow up of Acute on Chronic Respiratory Failure.  Patient remains on full support ventilator satting well with no fever or distress at this time.  Medications: Reviewed on Rounds  Physical Exam:  Vitals: Pulse 79 respirations 22 BP 105/58 O2 sat 100% temp 98.3  Ventilator Settings ventilator mode AC VC rate of 22 tidal volume 300 PEEP of 5 and FiO2 28%  . General: Comfortable at this time . Eyes: Grossly normal lids, irises & conjunctiva . ENT: grossly tongue is normal . Neck: no obvious mass . Cardiovascular: S1 S2 normal no gallop . Respiratory: No rales or rhonchi noted . Abdomen: soft . Skin: no rash seen on limited exam . Musculoskeletal: not rigid . Psychiatric:unable to assess . Neurologic: no seizure no involuntary movements         Lab Data:   Basic Metabolic Panel: Recent Labs  Lab 04/09/19 0547  NA 144  K 4.6  CL 106  CO2 33*  GLUCOSE 95  BUN 9  CREATININE 0.42*  CALCIUM 10.3    ABG: Recent Labs  Lab 04/10/19 1644 04/10/19 1825  PHART 7.251* 7.405  PCO2ART 74.5* 51.00*  PO2ART 65.9* 127*  HCO3 32.0* 31.3*  O2SAT 90.2 99.2    Liver Function Tests: No results for input(s): AST, ALT, ALKPHOS, BILITOT, PROT, ALBUMIN in the last 168 hours. No results for input(s): LIPASE, AMYLASE in the last 168 hours. No results for input(s): AMMONIA in the last 168 hours.  CBC: Recent Labs  Lab 04/09/19 0547  WBC 4.3  HGB 8.9*  HCT 29.8*  MCV 96.8  PLT 151    Cardiac Enzymes: No results for input(s): CKTOTAL, CKMB, CKMBINDEX, TROPONINI in the last 168 hours.  BNP (last 3 results) No results for input(s): BNP in the last 8760 hours.  ProBNP (last 3  results) No results for input(s): PROBNP in the last 8760 hours.  Radiological Exams: No results found.  Assessment/Plan Active Problems:   Acute on chronic respiratory failure with hypoxia (HCC)   Cardiac arrest (HCC)   COVID-19 virus infection   Severe sepsis (HCC)   Paroxysmal atrial fibrillation (HCC)   Aspiration pneumonia due to gastric secretions (HCC)   1. Acute on chronic respiratory failure hypoxia continue full support on the ventilator at this time.  Continue aggressive pulmonary toilet supportive measures.  Patient is unable to wean at this time. 2. Cardiac arrest right now rhythm is stable however has been having periodic issues with tachycardia now back in sinus rhythm but tachycardic 3. COVID-19 virus infection in resolution phase 4. Severe sepsis this has resolved 5. Paroxysmal atrial fibrillation patient did have issues with tachycardia consider cardiology evaluation 6. Aspiration pneumonia treated we will continue to follow   I have personally seen and evaluated the patient, evaluated laboratory and imaging results, formulated the assessment and plan and placed orders. The Patient requires high complexity decision making with multiple systems involvement.  Rounds were done with the Respiratory Therapy Director and Staff therapists and discussed with nursing staff also.  Yevonne Pax, MD [facility] Pulmonary Critical Care Medicine Sleep Medicine

## 2019-04-14 DIAGNOSIS — J9621 Acute and chronic respiratory failure with hypoxia: Secondary | ICD-10-CM | POA: Diagnosis not present

## 2019-04-14 DIAGNOSIS — J690 Pneumonitis due to inhalation of food and vomit: Secondary | ICD-10-CM | POA: Diagnosis not present

## 2019-04-14 DIAGNOSIS — U071 COVID-19: Secondary | ICD-10-CM | POA: Diagnosis not present

## 2019-04-14 DIAGNOSIS — I469 Cardiac arrest, cause unspecified: Secondary | ICD-10-CM | POA: Diagnosis not present

## 2019-04-14 NOTE — Progress Notes (Signed)
Pulmonary Critical Care Medicine [facility] [place]   PULMONARY CRITICAL CARE SERVICE  PROGRESS NOTE  Date of Service: 04/14/2019  Shelley Hunter  [id number]:[id number]  DOB: 1966-01-25   DOA: 03/20/2019  Referring Physician: Carron Curie, MD  HPI: Shelley Hunter is a 54 y.o. female seen for follow up of Acute on Chronic Respiratory Failure.  Patient currently is on full support on assist control mode has been on 28% FiO2 with volumes of 300 PEEP 5 seems to be tolerating the settings well.  Patient has consistently failed attempts at weaning with a rise in the PCO2  Medications: Reviewed on Rounds  Physical Exam:  Vitals: Temperature is 97.0 pulse 59 respiratory 22 blood pressure is 112/56 saturations 100%  Ventilator Settings mode ventilation assist control FiO2 28% tidal line 300 PEEP 5  . General: Comfortable at this time . Eyes: Grossly normal lids, irises & conjunctiva . ENT: grossly tongue is normal . Neck: no obvious mass . Cardiovascular: S1 S2 normal no gallop . Respiratory: Scattered rhonchi expansion is equal at this time . Abdomen: soft . Skin: no rash seen on limited exam . Musculoskeletal: not rigid . Psychiatric:unable to assess . Neurologic: no seizure no involuntary movements         Lab Data:   Basic Metabolic Panel: Recent Labs  Lab 04/09/19 0547  NA 144  K 4.6  CL 106  CO2 33*  GLUCOSE 95  BUN 9  CREATININE 0.42*  CALCIUM 10.3    ABG: Recent Labs  Lab 04/10/19 1644 04/10/19 1825  PHART 7.251* 7.405  PCO2ART 74.5* 51.00*  PO2ART 65.9* 127*  HCO3 32.0* 31.3*  O2SAT 90.2 99.2    Liver Function Tests: No results for input(s): AST, ALT, ALKPHOS, BILITOT, PROT, ALBUMIN in the last 168 hours. No results for input(s): LIPASE, AMYLASE in the last 168 hours. No results for input(s): AMMONIA in the last 168 hours.  CBC: Recent Labs  Lab 04/09/19 0547  WBC 4.3  HGB 8.9*  HCT 29.8*  MCV 96.8  PLT 151    Cardiac Enzymes: No results  for input(s): CKTOTAL, CKMB, CKMBINDEX, TROPONINI in the last 168 hours.  BNP (last 3 results) No results for input(s): BNP in the last 8760 hours.  ProBNP (last 3 results) No results for input(s): PROBNP in the last 8760 hours.  Radiological Exams: No results found.  Assessment/Plan Active Problems:   Acute on chronic respiratory failure with hypoxia (HCC)   Cardiac arrest (HCC)   COVID-19 virus infection   Severe sepsis (HCC)   Paroxysmal atrial fibrillation (HCC)   Aspiration pneumonia due to gastric secretions (HCC)   1. Acute on chronic respiratory failure hypoxia patient has failed weaning attempts patient will be at baseline on the ventilator assist control 2. Cardiac arrest rhythm is stable continue to monitor 3. COVID-19 virus infection treated resolved 4. Paroxysmal atrial fibrillation rate controlled 5. Severe sepsis resolved 6. Aspiration pneumonia has been treated we will continue to monitor   I have personally seen and evaluated the patient, evaluated laboratory and imaging results, formulated the assessment and plan and placed orders. The Patient requires high complexity decision making with multiple systems involvement.  Rounds were done with the Respiratory Therapy Director and Staff therapists and discussed with nursing staff also.  Yevonne Pax, MD [facility] Pulmonary Critical Care Medicine Sleep Medicine

## 2019-04-15 DIAGNOSIS — U071 COVID-19: Secondary | ICD-10-CM | POA: Diagnosis not present

## 2019-04-15 DIAGNOSIS — J9621 Acute and chronic respiratory failure with hypoxia: Secondary | ICD-10-CM | POA: Diagnosis not present

## 2019-04-15 DIAGNOSIS — I469 Cardiac arrest, cause unspecified: Secondary | ICD-10-CM | POA: Diagnosis not present

## 2019-04-15 DIAGNOSIS — J690 Pneumonitis due to inhalation of food and vomit: Secondary | ICD-10-CM | POA: Diagnosis not present

## 2019-04-15 LAB — CBC
HCT: 32.9 % — ABNORMAL LOW (ref 36.0–46.0)
Hemoglobin: 10.2 g/dL — ABNORMAL LOW (ref 12.0–15.0)
MCH: 28.7 pg (ref 26.0–34.0)
MCHC: 31 g/dL (ref 30.0–36.0)
MCV: 92.4 fL (ref 80.0–100.0)
Platelets: 112 10*3/uL — ABNORMAL LOW (ref 150–400)
RBC: 3.56 MIL/uL — ABNORMAL LOW (ref 3.87–5.11)
RDW: 16 % — ABNORMAL HIGH (ref 11.5–15.5)
WBC: 5 10*3/uL (ref 4.0–10.5)
nRBC: 0 % (ref 0.0–0.2)

## 2019-04-15 LAB — BASIC METABOLIC PANEL
Anion gap: 12 (ref 5–15)
BUN: 15 mg/dL (ref 6–20)
CO2: 26 mmol/L (ref 22–32)
Calcium: 10.2 mg/dL (ref 8.9–10.3)
Chloride: 103 mmol/L (ref 98–111)
Creatinine, Ser: 0.55 mg/dL (ref 0.44–1.00)
GFR calc Af Amer: >60 mL/min (ref >60)
GFR calc non Af Amer: >60 mL/min (ref >60)
Glucose, Bld: 93 mg/dL (ref 70–99)
Potassium: 4.9 mmol/L (ref 3.5–5.1)
Sodium: 141 mmol/L (ref 135–145)

## 2019-04-15 NOTE — Progress Notes (Signed)
Pulmonary Critical Care Medicine [facility] [place]   PULMONARY CRITICAL CARE SERVICE  PROGRESS NOTE  Date of Service: 04/15/2019  Shelley Hunter  IOE:[id number]  DOB: September 25, 1965   DOA: 03/20/2019  Referring Physician: Carron Curie, MD  HPI: Shelley Hunter is a 54 y.o. female seen for follow up of Acute on Chronic Respiratory Failure.  At this time patient is on assist control currently on 28% FiO2 with a PEEP of 5 patient was not able to do any weaning is failed consistently on weaning attempts.  She will need to be continued on tenuous ventilator support we are waiting for her home ventilator.  Medications: Reviewed on Rounds  Physical Exam:  Vitals: Temperature 97.0 pulse 62 respiratory rate 22 blood pressure is 151/79 saturations 96%  Ventilator Settings mode assist control FiO2 is 28% tidal volume 300 PEEP 5  . General: Comfortable at this time . Eyes: Grossly normal lids, irises & conjunctiva . ENT: grossly tongue is normal . Neck: no obvious mass . Cardiovascular: S1 S2 normal no gallop . Respiratory: Coarse breath sounds without rhonchi . Abdomen: soft . Skin: no rash seen on limited exam . Musculoskeletal: not rigid . Psychiatric:unable to assess . Neurologic: no seizure no involuntary movements         Lab Data:   Basic Metabolic Panel: Recent Labs  Lab 04/09/19 0547 04/15/19 0642  NA 144 141  K 4.6 4.9  CL 106 103  CO2 33* 26  GLUCOSE 95 93  BUN 9 15  CREATININE 0.42* 0.55  CALCIUM 10.3 10.2    ABG: Recent Labs  Lab 04/10/19 1644 04/10/19 1825  PHART 7.251* 7.405  PCO2ART 74.5* 51.00*  PO2ART 65.9* 127*  HCO3 32.0* 31.3*  O2SAT 90.2 99.2    Liver Function Tests: No results for input(s): AST, ALT, ALKPHOS, BILITOT, PROT, ALBUMIN in the last 168 hours. No results for input(s): LIPASE, AMYLASE in the last 168 hours. No results for input(s): AMMONIA in the last 168 hours.  CBC: Recent Labs  Lab 04/09/19 0547 04/15/19 0642  WBC 4.3  5.0  HGB 8.9* 10.2*  HCT 29.8* 32.9*  MCV 96.8 92.4  PLT 151 112*    Cardiac Enzymes: No results for input(s): CKTOTAL, CKMB, CKMBINDEX, TROPONINI in the last 168 hours.  BNP (last 3 results) No results for input(s): BNP in the last 8760 hours.  ProBNP (last 3 results) No results for input(s): PROBNP in the last 8760 hours.  Radiological Exams: No results found.  Assessment/Plan Active Problems:   Acute on chronic respiratory failure with hypoxia (HCC)   Cardiac arrest (HCC)   COVID-19 virus infection   Severe sepsis (HCC)   Paroxysmal atrial fibrillation (HCC)   Aspiration pneumonia due to gastric secretions (HCC)   1. Acute on chronic respiratory failure with hypoxia at this time she is on the ventilator and full support awaiting home ventilator to be arrived so that patient can be started on home ventilation. 2. COVID-19 virus infection recovery phase 3. Severe sepsis resolved 4. Aspiration pneumonia treated clinically improved 5. Paroxysmal atrial fibrillation rate has been stable 6. Cardiac arrest rhythm stable no further events   I have personally seen and evaluated the patient, evaluated laboratory and imaging results, formulated the assessment and plan and placed orders. The Patient requires high complexity decision making with multiple systems involvement.  Rounds were done with the Respiratory Therapy Director and Staff therapists and discussed with nursing staff also.  Yevonne Pax, MD [facility] Pulmonary Critical Care Medicine  Sleep Medicine

## 2019-04-16 DIAGNOSIS — U071 COVID-19: Secondary | ICD-10-CM | POA: Diagnosis not present

## 2019-04-16 DIAGNOSIS — J690 Pneumonitis due to inhalation of food and vomit: Secondary | ICD-10-CM | POA: Diagnosis not present

## 2019-04-16 DIAGNOSIS — I469 Cardiac arrest, cause unspecified: Secondary | ICD-10-CM | POA: Diagnosis not present

## 2019-04-16 DIAGNOSIS — J9621 Acute and chronic respiratory failure with hypoxia: Secondary | ICD-10-CM | POA: Diagnosis not present

## 2019-04-16 NOTE — Progress Notes (Addendum)
Pulmonary Critical Care Medicine [facility] [place]   PULMONARY CRITICAL CARE SERVICE  PROGRESS NOTE  Date of Service: 04/16/2019  Shelley Hunter  [id number]  DOB: 13-Apr-1965   DOA: 03/20/2019  Referring Physician: Carron Curie, MD  HPI: Shelley Hunter is a 54 y.o. female seen for follow up of Acute on Chronic Respiratory Failure.  Patient remains on full support at this time assist-control mode rate of 22 with an FiO2 of 28% satting well no distress.  Medications: Reviewed on Rounds  Physical Exam:  Vitals: Pulse 86 respirations 24 BP 136/76 O2 sat 97% temp 98.2  Ventilator Settings ventilator mode AC VC plus rate of 22 tidal volume 300 PEEP 5 and FiO2 of 28%  . General: Comfortable at this time . Eyes: Grossly normal lids, irises & conjunctiva . ENT: grossly tongue is normal . Neck: no obvious mass . Cardiovascular: S1 S2 normal no gallop . Respiratory: No rales or rhonchi noted . Abdomen: soft . Skin: no rash seen on limited exam . Musculoskeletal: not rigid . Psychiatric:unable to assess . Neurologic: no seizure no involuntary movements         Lab Data:   Basic Metabolic Panel: Recent Labs  Lab 04/15/19 0642  NA 141  K 4.9  CL 103  CO2 26  GLUCOSE 93  BUN 15  CREATININE 0.55  CALCIUM 10.2    ABG: Recent Labs  Lab 04/10/19 1644 04/10/19 1825  PHART 7.251* 7.405  PCO2ART 74.5* 51.00*  PO2ART 65.9* 127*  HCO3 32.0* 31.3*  O2SAT 90.2 99.2    Liver Function Tests: No results for input(s): AST, ALT, ALKPHOS, BILITOT, PROT, ALBUMIN in the last 168 hours. No results for input(s): LIPASE, AMYLASE in the last 168 hours. No results for input(s): AMMONIA in the last 168 hours.  CBC: Recent Labs  Lab 04/15/19 0642  WBC 5.0  HGB 10.2*  HCT 32.9*  MCV 92.4  PLT 112*    Cardiac Enzymes: No results for input(s): CKTOTAL, CKMB, CKMBINDEX, TROPONINI in the last 168 hours.  BNP (last 3 results) No results for input(s): BNP in the last 8760  hours.  ProBNP (last 3 results) No results for input(s): PROBNP in the last 8760 hours.  Radiological Exams: No results found.  Assessment/Plan Active Problems:   Acute on chronic respiratory failure with hypoxia (HCC)   Cardiac arrest (HCC)   COVID-19 virus infection   Severe sepsis (HCC)   Paroxysmal atrial fibrillation (HCC)   Aspiration pneumonia due to gastric secretions (HCC)   1. Acute on chronic respiratory failure with hypoxia patient is on ventilator at this time trilogy ventilator is being delivered and family has been trained.  Plan is for husband to learn to operate Trelegy and patient can go home coming days with Trelegy. 2. COVID-19 virus infection recovery phase 3. Severe sepsis resolved 4. Aspiration pneumonia treated clinically improved 5. Paroxysmal atrial fibrillation rate has been stable 6. Cardiac arrest rhythm stable no further events   I have personally seen and evaluated the patient, evaluated laboratory and imaging results, formulated the assessment and plan and placed orders. The Patient requires high complexity decision making with multiple systems involvement.  Rounds were done with the Respiratory Therapy Director and Staff therapists and discussed with nursing staff also.  Yevonne Pax, MD [facility] Pulmonary Critical Care Medicine Sleep Medicine

## 2019-04-17 DIAGNOSIS — I469 Cardiac arrest, cause unspecified: Secondary | ICD-10-CM | POA: Diagnosis not present

## 2019-04-17 DIAGNOSIS — J690 Pneumonitis due to inhalation of food and vomit: Secondary | ICD-10-CM | POA: Diagnosis not present

## 2019-04-17 DIAGNOSIS — U071 COVID-19: Secondary | ICD-10-CM | POA: Diagnosis not present

## 2019-04-17 DIAGNOSIS — J9621 Acute and chronic respiratory failure with hypoxia: Secondary | ICD-10-CM | POA: Diagnosis not present

## 2019-04-17 LAB — BLOOD GAS, ARTERIAL
Acid-Base Excess: 5.4 mmol/L — ABNORMAL HIGH (ref 0.0–2.0)
Bicarbonate: 29.1 mmol/L — ABNORMAL HIGH (ref 20.0–28.0)
FIO2: 28
O2 Saturation: 98.5 %
Patient temperature: 36.6
pCO2 arterial: 39.5 mmHg (ref 32.0–48.0)
pH, Arterial: 7.478 — ABNORMAL HIGH (ref 7.350–7.450)
pO2, Arterial: 104 mmHg (ref 83.0–108.0)

## 2019-04-17 NOTE — Progress Notes (Signed)
Pulmonary Critical Care Medicine [facility] [place]   PULMONARY CRITICAL CARE SERVICE  PROGRESS NOTE  Date of Service: 04/17/2019  Shelley Hunter  [id number]  DOB: December 23, 1965   DOA: 03/20/2019  Referring Physician: Carron Curie, MD  HPI: Shelley Hunter is a 54 y.o. female seen for follow up of Acute on Chronic Respiratory Failure.  Patient is on the ventilator on full support apparently the patient's husband was here for training on the trilogy vent  Medications: Reviewed on Rounds  Physical Exam:  Vitals: Temperature is 97.0 pulse 82 respiratory rate 27 blood pressure is 131/40 saturations 99%  Ventilator Settings on assist control FiO2 28% tidal volume 3.  PEEP 5  . General: Comfortable at this time . Eyes: Grossly normal lids, irises & conjunctiva . ENT: grossly tongue is normal . Neck: no obvious mass . Cardiovascular: S1 S2 normal no gallop . Respiratory: No rhonchi coarse breath sounds . Abdomen: soft . Skin: no rash seen on limited exam . Musculoskeletal: not rigid . Psychiatric:unable to assess . Neurologic: no seizure no involuntary movements         Lab Data:   Basic Metabolic Panel: Recent Labs  Lab 04/15/19 0642  NA 141  K 4.9  CL 103  CO2 26  GLUCOSE 93  BUN 15  CREATININE 0.55  CALCIUM 10.2    ABG: Recent Labs  Lab 04/10/19 1644 04/10/19 1825 04/17/19 0950  PHART 7.251* 7.405 7.478*  PCO2ART 74.5* 51.00* 39.5  PO2ART 65.9* 127* 104  HCO3 32.0* 31.3* 29.1*  O2SAT 90.2 99.2 98.5    Liver Function Tests: No results for input(s): AST, ALT, ALKPHOS, BILITOT, PROT, ALBUMIN in the last 168 hours. No results for input(s): LIPASE, AMYLASE in the last 168 hours. No results for input(s): AMMONIA in the last 168 hours.  CBC: Recent Labs  Lab 04/15/19 0642  WBC 5.0  HGB 10.2*  HCT 32.9*  MCV 92.4  PLT 112*    Cardiac Enzymes: No results for input(s): CKTOTAL, CKMB, CKMBINDEX, TROPONINI in the last 168 hours.  BNP (last 3  results) No results for input(s): BNP in the last 8760 hours.  ProBNP (last 3 results) No results for input(s): PROBNP in the last 8760 hours.  Radiological Exams: No results found.  Assessment/Plan Active Problems:   Acute on chronic respiratory failure with hypoxia (HCC)   Cardiac arrest (HCC)   COVID-19 virus infection   Severe sepsis (HCC)   Paroxysmal atrial fibrillation (HCC)   Aspiration pneumonia due to gastric secretions (HCC)   1. Acute on chronic respiratory failure hypoxia plan is to continue with full support on the ventilator right now home training is underway 2. Cardiac arrest. 3. COVID-19 virus infection treated 4. Severe sepsis resolved 5. Paroxysmal atrial fibrillation rate controlled 6. Aspiration pneumonia at baseline   I have personally seen and evaluated the patient, evaluated laboratory and imaging results, formulated the assessment and plan and placed orders. The Patient requires high complexity decision making with multiple systems involvement.  Rounds were done with the Respiratory Therapy Director and Staff therapists and discussed with nursing staff also.  Yevonne Pax, MD [facility] Pulmonary Critical Care Medicine Sleep Medicine

## 2019-04-18 MED ORDER — GENERIC EXTERNAL MEDICATION
Status: DC
Start: ? — End: 2019-04-18

## 2020-12-05 IMAGING — DX DG CHEST 1V PORT
1 series · 1 of 1 positions shown · non-contrast
Comparison: Abdominal radiograph 03/20/2019

CLINICAL DATA: Respiratory failure

EXAM:
PORTABLE CHEST 1 VIEW

[chest]
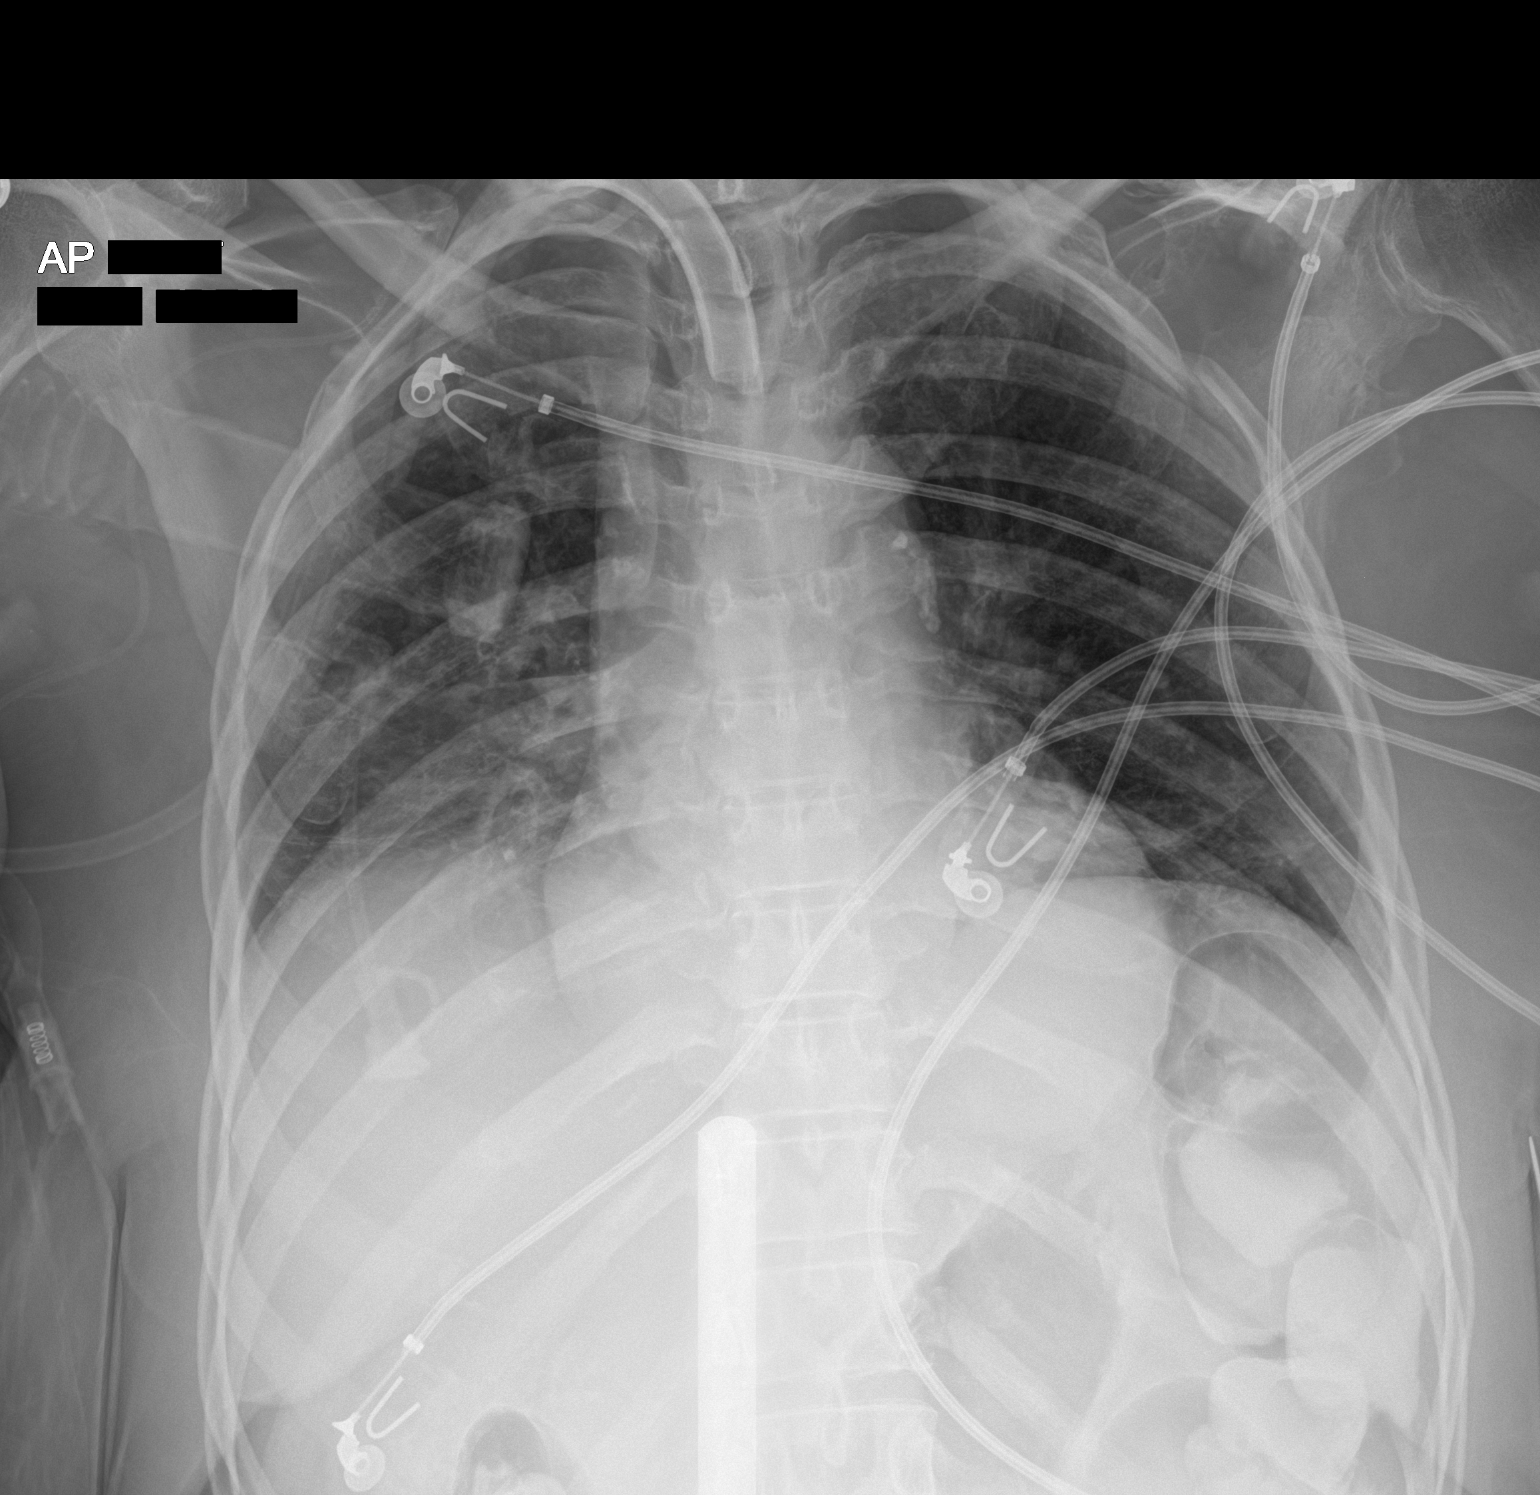

[1 of 1 positions shown; findings below may reference images not displayed]

FINDINGS: Tracheostomy tube terminates in the mid trachea. Linear metallic
density projecting to the right of midline over the upper abdomen is
indeterminate, correlate for a brace or device external to the
patient. Telemetry leads overlie the chest. There is some mixed
patchy and bandlike opacity in the right infrahilar lung which
partially obscures the right hemidiaphragm. Calcifications along the
left heart border may reflect calcified hilar adenopathy versus
atherosclerotic calcification of the aorta. Cardiomediastinal
contours are otherwise unremarkable. Radiodense contrast material is
projecting over the colon. Degenerative changes are present in the
imaged spine and shoulders.
IMPRESSION: 1. Right infrahilar opacity could reflect atelectasis, infection,
edema or a combination there of.
2. Calcifications along the left mediastinal border may reflect
calcified hilar adenopathy versus atherosclerotic calcification of
the aorta.
3. Tracheostomy tube terminates in the mid trachea.
4. Linear metallic density projects to the right of the spine in the
upper abdomen, correlate for finding external to the patient.

## 2020-12-17 IMAGING — DX DG CHEST 1V PORT
1 series · 1 of 1 positions shown · non-contrast
Comparison: None.

CLINICAL DATA: Stroke

EXAM:
PORTABLE CHEST 1 VIEW

[chest ap]
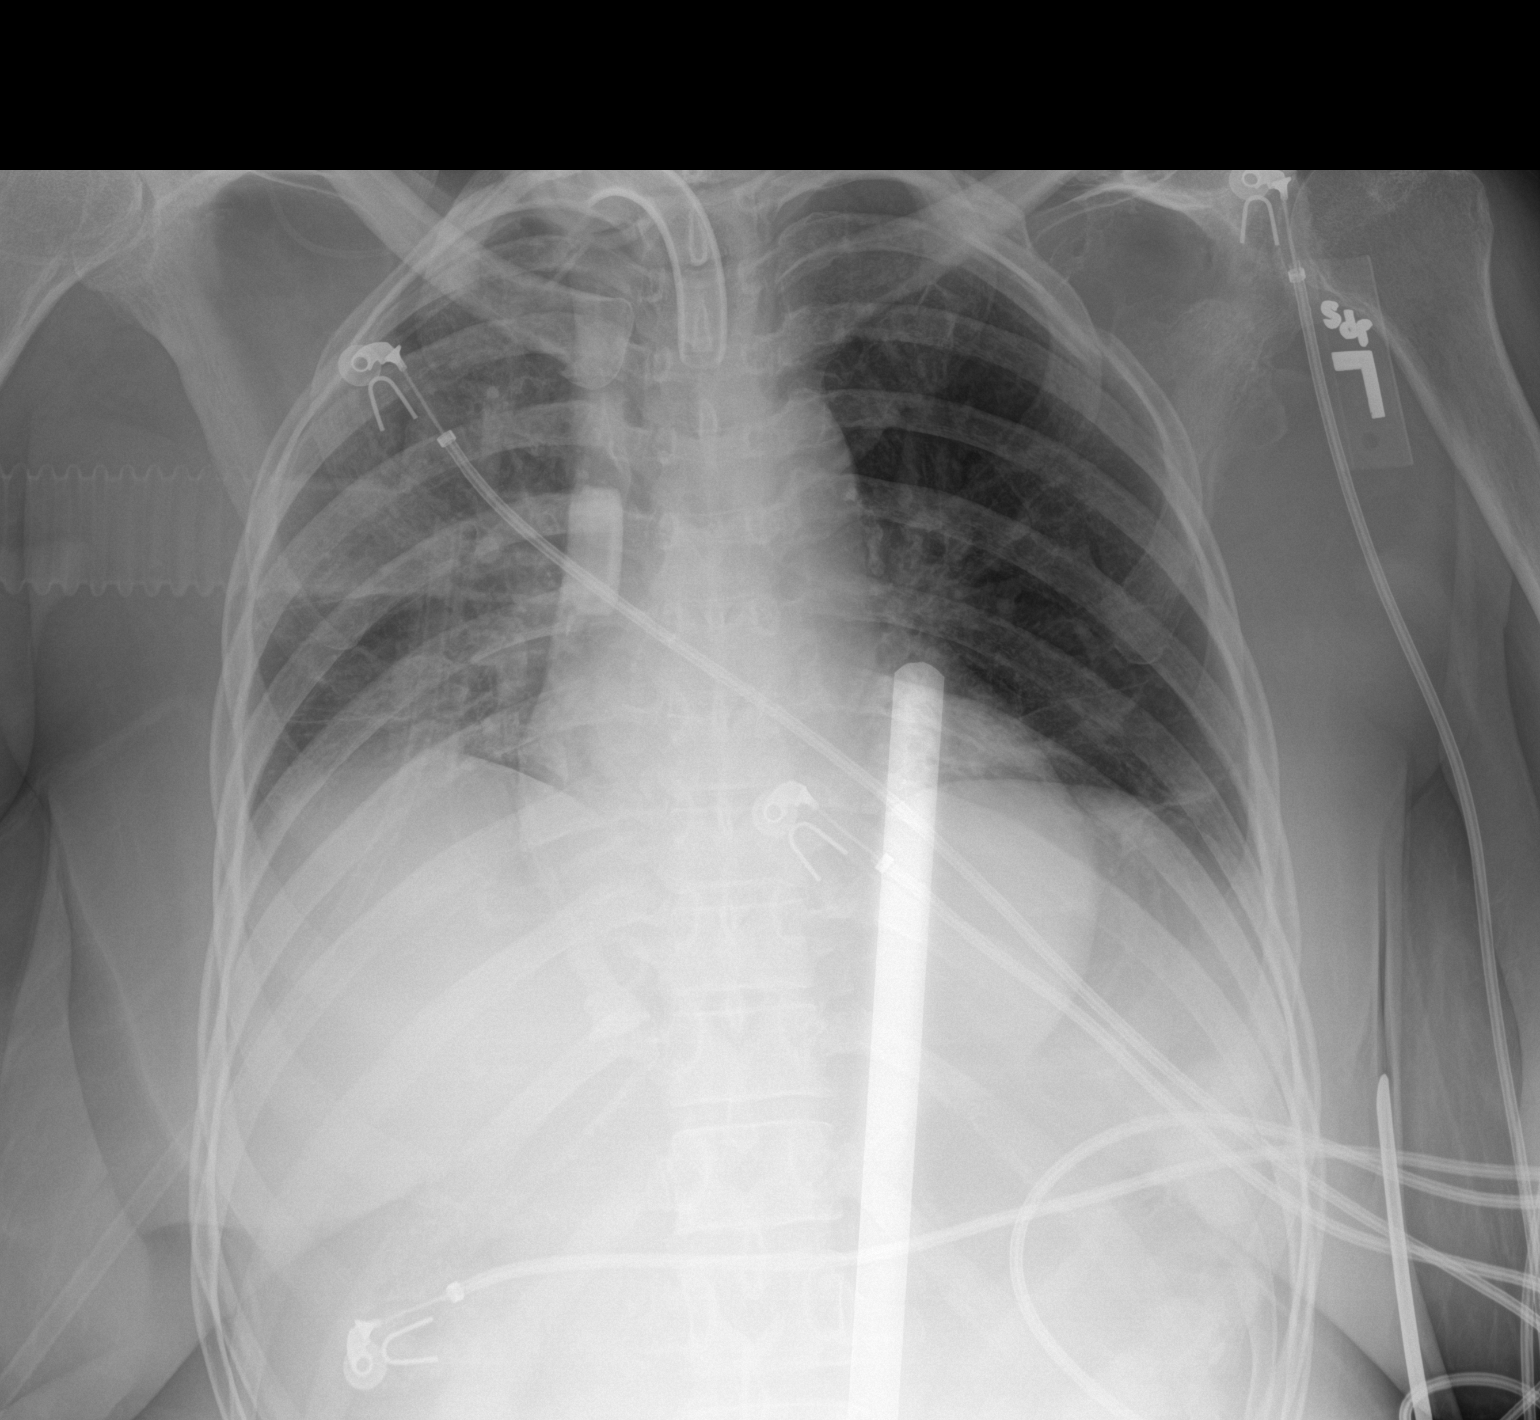

[1 of 1 positions shown; findings below may reference images not displayed]

FINDINGS: Low volume chest with diffuse hazy opacity on the right and streaky
density at the left base. Normal heart size for low volumes.
Tracheostomy tube is present. No evidence of effusion or
pneumothorax.

Left upper rib fractures.
IMPRESSION: Low volume chest with hazy and streaky opacity likely reflecting
atelectasis.

## 2023-03-24 DEATH — deceased
